# Patient Record
Sex: Male | Born: 1935 | Race: White | Hispanic: No | Marital: Married | Smoking: Former smoker
Health system: Southern US, Community
[De-identification: ages and names within clinical notes are randomized; demographics above are authoritative.]

## PROBLEM LIST (undated history)

## (undated) DIAGNOSIS — E119 Type 2 diabetes mellitus without complications: Secondary | ICD-10-CM

## (undated) DIAGNOSIS — R279 Unspecified lack of coordination: Secondary | ICD-10-CM

## (undated) DIAGNOSIS — I2510 Atherosclerotic heart disease of native coronary artery without angina pectoris: Secondary | ICD-10-CM

## (undated) DIAGNOSIS — E1139 Type 2 diabetes mellitus with other diabetic ophthalmic complication: Secondary | ICD-10-CM

## (undated) DIAGNOSIS — Z87898 Personal history of other specified conditions: Secondary | ICD-10-CM

## (undated) DIAGNOSIS — I10 Essential (primary) hypertension: Secondary | ICD-10-CM

## (undated) DIAGNOSIS — Z9181 History of falling: Secondary | ICD-10-CM

## (undated) DIAGNOSIS — I441 Atrioventricular block, second degree: Secondary | ICD-10-CM

## (undated) DIAGNOSIS — K219 Gastro-esophageal reflux disease without esophagitis: Secondary | ICD-10-CM

## (undated) DIAGNOSIS — E785 Hyperlipidemia, unspecified: Secondary | ICD-10-CM

## (undated) DIAGNOSIS — E1129 Type 2 diabetes mellitus with other diabetic kidney complication: Secondary | ICD-10-CM

## (undated) HISTORY — PX: EYE SURGERY: SHX253

## (undated) HISTORY — DX: Type 2 diabetes mellitus without complications: E11.9

## (undated) HISTORY — DX: Type 2 diabetes mellitus with other diabetic kidney complication: E11.29

## (undated) HISTORY — DX: History of falling: Z91.81

## (undated) HISTORY — DX: Atherosclerotic heart disease of native coronary artery without angina pectoris: I25.10

## (undated) HISTORY — DX: Hyperlipidemia, unspecified: E78.5

## (undated) HISTORY — PX: HERNIA REPAIR: SHX51

## (undated) HISTORY — PX: IRRIGATION AND DEBRIDEMENT SEBACEOUS CYST: SHX5255

## (undated) HISTORY — DX: Personal history of other specified conditions: Z87.898

## (undated) HISTORY — DX: Type 2 diabetes mellitus with other diabetic ophthalmic complication: E11.39

## (undated) HISTORY — DX: Unspecified lack of coordination: R27.9

## (undated) HISTORY — PX: APPENDECTOMY: SHX54

## (undated) HISTORY — DX: Gastro-esophageal reflux disease without esophagitis: K21.9

## (undated) HISTORY — DX: Atrioventricular block, second degree: I44.1

## (undated) HISTORY — DX: Essential (primary) hypertension: I10

---

## 1991-10-22 HISTORY — PX: OTHER SURGICAL HISTORY: SHX169

## 2000-03-26 ENCOUNTER — Ambulatory Visit (HOSPITAL_COMMUNITY): Admission: RE | Admit: 2000-03-26 | Discharge: 2000-03-26 | Payer: Self-pay | Admitting: Cardiology

## 2000-03-26 ENCOUNTER — Encounter: Payer: Self-pay | Admitting: Cardiology

## 2000-08-12 ENCOUNTER — Ambulatory Visit (HOSPITAL_COMMUNITY): Admission: RE | Admit: 2000-08-12 | Discharge: 2000-08-12 | Payer: Self-pay | Admitting: Cardiology

## 2001-01-30 ENCOUNTER — Ambulatory Visit (HOSPITAL_COMMUNITY): Admission: RE | Admit: 2001-01-30 | Discharge: 2001-01-30 | Payer: Self-pay | Admitting: Ophthalmology

## 2001-07-22 ENCOUNTER — Encounter: Payer: Self-pay | Admitting: Ophthalmology

## 2001-07-24 ENCOUNTER — Ambulatory Visit (HOSPITAL_COMMUNITY): Admission: RE | Admit: 2001-07-24 | Discharge: 2001-07-24 | Payer: Self-pay | Admitting: Ophthalmology

## 2003-01-20 ENCOUNTER — Encounter (INDEPENDENT_AMBULATORY_CARE_PROVIDER_SITE_OTHER): Payer: Self-pay | Admitting: Specialist

## 2003-01-20 ENCOUNTER — Ambulatory Visit (HOSPITAL_COMMUNITY): Admission: RE | Admit: 2003-01-20 | Discharge: 2003-01-20 | Payer: Self-pay | Admitting: General Surgery

## 2003-12-14 ENCOUNTER — Encounter: Admission: RE | Admit: 2003-12-14 | Discharge: 2003-12-14 | Payer: Self-pay | Admitting: Internal Medicine

## 2004-10-11 ENCOUNTER — Ambulatory Visit: Payer: Self-pay | Admitting: Internal Medicine

## 2004-10-18 ENCOUNTER — Ambulatory Visit: Payer: Self-pay | Admitting: Endocrinology

## 2004-11-19 ENCOUNTER — Ambulatory Visit: Payer: Self-pay | Admitting: Endocrinology

## 2005-01-02 ENCOUNTER — Ambulatory Visit: Payer: Self-pay | Admitting: Gastroenterology

## 2005-02-12 ENCOUNTER — Ambulatory Visit: Payer: Self-pay | Admitting: Gastroenterology

## 2005-02-12 ENCOUNTER — Encounter: Admission: RE | Admit: 2005-02-12 | Discharge: 2005-02-12 | Payer: Self-pay | Admitting: Gastroenterology

## 2005-02-21 ENCOUNTER — Ambulatory Visit (HOSPITAL_COMMUNITY): Admission: RE | Admit: 2005-02-21 | Discharge: 2005-02-21 | Payer: Self-pay | Admitting: Gastroenterology

## 2005-02-21 ENCOUNTER — Encounter (INDEPENDENT_AMBULATORY_CARE_PROVIDER_SITE_OTHER): Payer: Self-pay | Admitting: *Deleted

## 2005-02-21 ENCOUNTER — Ambulatory Visit: Payer: Self-pay | Admitting: Gastroenterology

## 2005-09-30 ENCOUNTER — Ambulatory Visit: Payer: Self-pay | Admitting: Internal Medicine

## 2005-10-08 ENCOUNTER — Ambulatory Visit: Payer: Self-pay | Admitting: Internal Medicine

## 2006-05-26 ENCOUNTER — Ambulatory Visit: Payer: Self-pay | Admitting: Endocrinology

## 2006-08-22 ENCOUNTER — Ambulatory Visit: Payer: Self-pay | Admitting: Internal Medicine

## 2006-10-21 HISTORY — PX: ESOPHAGOGASTRODUODENOSCOPY: SHX1529

## 2006-10-22 ENCOUNTER — Ambulatory Visit: Payer: Self-pay | Admitting: Internal Medicine

## 2006-10-30 ENCOUNTER — Ambulatory Visit: Payer: Self-pay | Admitting: Gastroenterology

## 2006-11-19 ENCOUNTER — Ambulatory Visit: Payer: Self-pay | Admitting: Gastroenterology

## 2006-11-19 ENCOUNTER — Encounter (INDEPENDENT_AMBULATORY_CARE_PROVIDER_SITE_OTHER): Payer: Self-pay | Admitting: *Deleted

## 2006-12-02 ENCOUNTER — Ambulatory Visit: Payer: Self-pay

## 2007-06-11 ENCOUNTER — Ambulatory Visit: Payer: Self-pay | Admitting: Endocrinology

## 2007-06-11 LAB — CONVERTED CEMR LAB
BUN: 22 mg/dL (ref 6–23)
Calcium: 9.7 mg/dL (ref 8.4–10.5)
GFR calc Af Amer: 93 mL/min
GFR calc non Af Amer: 77 mL/min
Microalb Creat Ratio: 9.9 mg/g (ref 0.0–30.0)
Microalb, Ur: 0.5 mg/dL (ref 0.0–1.9)
Potassium: 4.3 meq/L (ref 3.5–5.1)

## 2007-07-10 ENCOUNTER — Encounter: Payer: Self-pay | Admitting: *Deleted

## 2007-07-10 DIAGNOSIS — I2510 Atherosclerotic heart disease of native coronary artery without angina pectoris: Secondary | ICD-10-CM | POA: Insufficient documentation

## 2007-07-10 DIAGNOSIS — E1139 Type 2 diabetes mellitus with other diabetic ophthalmic complication: Secondary | ICD-10-CM | POA: Insufficient documentation

## 2007-07-10 DIAGNOSIS — G609 Hereditary and idiopathic neuropathy, unspecified: Secondary | ICD-10-CM | POA: Insufficient documentation

## 2007-07-10 DIAGNOSIS — E1129 Type 2 diabetes mellitus with other diabetic kidney complication: Secondary | ICD-10-CM

## 2007-07-10 DIAGNOSIS — E785 Hyperlipidemia, unspecified: Secondary | ICD-10-CM

## 2007-08-19 ENCOUNTER — Ambulatory Visit: Payer: Self-pay | Admitting: Internal Medicine

## 2007-10-05 ENCOUNTER — Telehealth: Payer: Self-pay | Admitting: Internal Medicine

## 2007-10-06 ENCOUNTER — Ambulatory Visit: Payer: Self-pay | Admitting: Internal Medicine

## 2007-10-07 DIAGNOSIS — I10 Essential (primary) hypertension: Secondary | ICD-10-CM | POA: Insufficient documentation

## 2008-02-09 ENCOUNTER — Ambulatory Visit: Payer: Self-pay | Admitting: Internal Medicine

## 2008-02-09 LAB — CONVERTED CEMR LAB
ALT: 13 units/L (ref 0–53)
AST: 18 units/L (ref 0–37)
Alkaline Phosphatase: 68 units/L (ref 39–117)
Basophils Absolute: 0 10*3/uL (ref 0.0–0.1)
Bilirubin Urine: NEGATIVE
Bilirubin, Direct: 0.1 mg/dL (ref 0.0–0.3)
CO2: 32 meq/L (ref 19–32)
Calcium: 9.6 mg/dL (ref 8.4–10.5)
Chloride: 103 meq/L (ref 96–112)
Glucose, Bld: 115 mg/dL — ABNORMAL HIGH (ref 70–99)
Hemoglobin: 11.8 g/dL — ABNORMAL LOW (ref 13.0–17.0)
LDL Cholesterol: 61 mg/dL (ref 0–99)
Leukocytes, UA: NEGATIVE
Lymphocytes Relative: 30.6 % (ref 12.0–46.0)
MCHC: 33.6 g/dL (ref 30.0–36.0)
Monocytes Relative: 8.8 % (ref 3.0–12.0)
Neutro Abs: 3.8 10*3/uL (ref 1.4–7.7)
Neutrophils Relative %: 56.3 % (ref 43.0–77.0)
Nitrite: NEGATIVE
PSA: 0.09 ng/mL — ABNORMAL LOW (ref 0.10–4.00)
Potassium: 4.5 meq/L (ref 3.5–5.1)
RBC: 3.9 M/uL — ABNORMAL LOW (ref 4.22–5.81)
RDW: 12.2 % (ref 11.5–14.6)
Sodium: 140 meq/L (ref 135–145)
Specific Gravity, Urine: 1.015 (ref 1.000–1.03)
Total Bilirubin: 0.9 mg/dL (ref 0.3–1.2)
Total CHOL/HDL Ratio: 3.6
Total Protein: 7.3 g/dL (ref 6.0–8.3)
Urobilinogen, UA: 0.2 (ref 0.0–1.0)
pH: 5.5 (ref 5.0–8.0)

## 2008-02-16 ENCOUNTER — Ambulatory Visit: Payer: Self-pay | Admitting: Internal Medicine

## 2008-02-17 ENCOUNTER — Encounter: Payer: Self-pay | Admitting: Internal Medicine

## 2008-02-17 DIAGNOSIS — R279 Unspecified lack of coordination: Secondary | ICD-10-CM

## 2008-02-17 DIAGNOSIS — K219 Gastro-esophageal reflux disease without esophagitis: Secondary | ICD-10-CM

## 2008-02-17 HISTORY — DX: Gastro-esophageal reflux disease without esophagitis: K21.9

## 2008-02-17 HISTORY — DX: Unspecified lack of coordination: R27.9

## 2008-02-24 ENCOUNTER — Encounter: Admission: RE | Admit: 2008-02-24 | Discharge: 2008-02-24 | Payer: Self-pay | Admitting: Internal Medicine

## 2008-02-28 ENCOUNTER — Encounter: Payer: Self-pay | Admitting: Internal Medicine

## 2008-06-28 ENCOUNTER — Ambulatory Visit: Payer: Self-pay | Admitting: Internal Medicine

## 2008-06-28 ENCOUNTER — Telehealth: Payer: Self-pay | Admitting: Internal Medicine

## 2008-06-28 DIAGNOSIS — R079 Chest pain, unspecified: Secondary | ICD-10-CM | POA: Insufficient documentation

## 2008-08-11 ENCOUNTER — Ambulatory Visit: Payer: Self-pay | Admitting: Internal Medicine

## 2008-10-12 ENCOUNTER — Telehealth: Payer: Self-pay | Admitting: Internal Medicine

## 2008-11-07 ENCOUNTER — Telehealth: Payer: Self-pay | Admitting: Endocrinology

## 2009-01-27 ENCOUNTER — Telehealth (INDEPENDENT_AMBULATORY_CARE_PROVIDER_SITE_OTHER): Payer: Self-pay | Admitting: *Deleted

## 2009-01-30 ENCOUNTER — Ambulatory Visit: Payer: Self-pay | Admitting: Endocrinology

## 2009-02-08 ENCOUNTER — Telehealth: Payer: Self-pay | Admitting: Family Medicine

## 2009-02-13 ENCOUNTER — Ambulatory Visit: Payer: Self-pay | Admitting: Family Medicine

## 2009-02-13 DIAGNOSIS — Z87898 Personal history of other specified conditions: Secondary | ICD-10-CM

## 2009-02-13 HISTORY — DX: Personal history of other specified conditions: Z87.898

## 2009-02-17 ENCOUNTER — Ambulatory Visit: Payer: Self-pay | Admitting: Family Medicine

## 2009-02-17 LAB — CONVERTED CEMR LAB
BUN: 23 mg/dL (ref 6–23)
Basophils Absolute: 0.1 10*3/uL (ref 0.0–0.1)
Bilirubin Urine: NEGATIVE
Bilirubin, Direct: 0 mg/dL (ref 0.0–0.3)
Blood in Urine, dipstick: NEGATIVE
Cholesterol: 131 mg/dL (ref 0–200)
Creatinine, Ser: 1 mg/dL (ref 0.4–1.5)
GFR calc non Af Amer: 76.14 mL/min (ref >60)
Glucose, Bld: 126 mg/dL — ABNORMAL HIGH (ref 70–99)
Glucose, Urine, Semiquant: NEGATIVE
HCT: 34.8 % — ABNORMAL LOW (ref 39.0–52.0)
Hgb A1c MFr Bld: 6.9 % — ABNORMAL HIGH (ref 4.6–6.5)
Ketones, urine, test strip: NEGATIVE
LDL Cholesterol: 74 mg/dL (ref 0–99)
Lymphs Abs: 2.6 10*3/uL (ref 0.7–4.0)
MCV: 90.6 fL (ref 78.0–100.0)
Monocytes Absolute: 0.7 10*3/uL (ref 0.1–1.0)
Monocytes Relative: 7.2 % (ref 3.0–12.0)
Neutrophils Relative %: 62.6 % (ref 43.0–77.0)
PSA: 0.09 ng/mL — ABNORMAL LOW (ref 0.10–4.00)
Platelets: 255 10*3/uL (ref 150.0–400.0)
Potassium: 4.9 meq/L (ref 3.5–5.1)
Protein, U semiquant: NEGATIVE
RDW: 12.1 % (ref 11.5–14.6)
TSH: 2.28 microintl units/mL (ref 0.35–5.50)
Total Bilirubin: 0.7 mg/dL (ref 0.3–1.2)
Triglycerides: 97 mg/dL (ref 0.0–149.0)
Urobilinogen, UA: 0.2
VLDL: 19.4 mg/dL (ref 0.0–40.0)
pH: 5

## 2009-02-24 ENCOUNTER — Ambulatory Visit: Payer: Self-pay | Admitting: Family Medicine

## 2009-02-24 DIAGNOSIS — E119 Type 2 diabetes mellitus without complications: Secondary | ICD-10-CM | POA: Insufficient documentation

## 2009-04-07 ENCOUNTER — Telehealth: Payer: Self-pay | Admitting: Endocrinology

## 2009-04-21 ENCOUNTER — Telehealth: Payer: Self-pay | Admitting: Endocrinology

## 2009-05-31 ENCOUNTER — Ambulatory Visit: Payer: Self-pay | Admitting: Family Medicine

## 2009-09-25 ENCOUNTER — Ambulatory Visit: Payer: Self-pay | Admitting: Family Medicine

## 2009-09-26 LAB — CONVERTED CEMR LAB: Hgb A1c MFr Bld: 6.8 % — ABNORMAL HIGH (ref 4.6–6.5)

## 2009-10-02 ENCOUNTER — Ambulatory Visit: Payer: Self-pay | Admitting: Family Medicine

## 2009-10-16 ENCOUNTER — Telehealth (INDEPENDENT_AMBULATORY_CARE_PROVIDER_SITE_OTHER): Payer: Self-pay | Admitting: *Deleted

## 2009-10-22 ENCOUNTER — Encounter: Payer: Self-pay | Admitting: Internal Medicine

## 2009-12-14 ENCOUNTER — Encounter: Payer: Self-pay | Admitting: Internal Medicine

## 2009-12-27 ENCOUNTER — Ambulatory Visit: Payer: Self-pay | Admitting: Family Medicine

## 2009-12-28 LAB — CONVERTED CEMR LAB
ALT: 14 units/L (ref 0–53)
AST: 19 units/L (ref 0–37)
Albumin: 3.8 g/dL (ref 3.5–5.2)
BUN: 23 mg/dL (ref 6–23)
CO2: 30 meq/L (ref 19–32)
Chloride: 107 meq/L (ref 96–112)
Cholesterol: 116 mg/dL (ref 0–200)
Glucose, Bld: 250 mg/dL — ABNORMAL HIGH (ref 70–99)
Hgb A1c MFr Bld: 6.8 % — ABNORMAL HIGH (ref 4.6–6.5)
Potassium: 5.2 meq/L — ABNORMAL HIGH (ref 3.5–5.1)
Total Bilirubin: 0.8 mg/dL (ref 0.3–1.2)
Total Protein: 7.1 g/dL (ref 6.0–8.3)

## 2010-01-09 ENCOUNTER — Ambulatory Visit: Payer: Self-pay | Admitting: Family Medicine

## 2010-01-09 DIAGNOSIS — Z9181 History of falling: Secondary | ICD-10-CM

## 2010-01-09 HISTORY — DX: History of falling: Z91.81

## 2010-03-14 ENCOUNTER — Encounter (INDEPENDENT_AMBULATORY_CARE_PROVIDER_SITE_OTHER): Payer: Self-pay | Admitting: *Deleted

## 2010-03-28 ENCOUNTER — Ambulatory Visit: Payer: Self-pay | Admitting: Family Medicine

## 2010-04-11 ENCOUNTER — Telehealth: Payer: Self-pay | Admitting: Family Medicine

## 2010-05-10 ENCOUNTER — Telehealth: Payer: Self-pay | Admitting: Family Medicine

## 2010-05-30 ENCOUNTER — Ambulatory Visit: Payer: Self-pay | Admitting: Family Medicine

## 2010-05-31 LAB — CONVERTED CEMR LAB: Hgb A1c MFr Bld: 7 % — ABNORMAL HIGH (ref 4.6–6.5)

## 2010-06-07 ENCOUNTER — Ambulatory Visit: Payer: Self-pay | Admitting: Family Medicine

## 2010-06-19 ENCOUNTER — Telehealth: Payer: Self-pay | Admitting: Family Medicine

## 2010-09-27 ENCOUNTER — Ambulatory Visit: Payer: Self-pay | Admitting: Family Medicine

## 2010-09-28 LAB — CONVERTED CEMR LAB
CO2: 32 meq/L (ref 19–32)
Chloride: 100 meq/L (ref 96–112)
Creatinine, Ser: 1.1 mg/dL (ref 0.4–1.5)

## 2010-10-19 ENCOUNTER — Ambulatory Visit: Payer: Self-pay | Admitting: Family Medicine

## 2010-10-23 ENCOUNTER — Telehealth: Payer: Self-pay | Admitting: Family Medicine

## 2010-11-11 ENCOUNTER — Encounter: Payer: Self-pay | Admitting: Gastroenterology

## 2010-11-22 NOTE — Progress Notes (Signed)
Summary: refill mail order  Phone Note Refill Request Message from:  Patient  Refills Requested: Medication #1:  NOVOLOG MIX 70/30 70-30 % SUSP TAKE 20 UNITS Q AM AND 25 IN PM.  Medication #2:  FUROSEMIDE 20 MG  TABS once daily  Medication #3:  LISINOPRIL 20 MG  TABS once daily  Medication #4:  SIMVASTATIN 80 MG  TABS 1 q PM send to [facility]  Initial call taken by: Warnell Forester,  October 23, 2010 9:17 AM    Prescriptions: NOVOLOG MIX 70/30 70-30 % SUSP (INSULIN ASPART PROT & ASPART) TAKE 20 UNITS Q AM AND 25 IN PM  #6 vials x 3   Entered by:   Sid Falcon LPN   Authorized by:   Evelena Peat MD   Signed by:   Sid Falcon LPN on 45/40/9811   Method used:   Faxed to ...       [facility] (mail-order)             , [place]         Ph: [phone]       Fax: [phone]   RxID:   [id number] SIMVASTATIN 80 MG  TABS (SIMVASTATIN) 1 q PM  #90 x 3   Entered by:   Sid Falcon LPN   Authorized by:   Evelena Peat MD   Signed by:   Sid Falcon LPN on 27/25/3664   Method used:   Faxed to ...       [facility] (mail-order)             , [place]         Ph: [phone]       Fax: [phone]   RxID:   [id number] LISINOPRIL 20 MG  TABS (LISINOPRIL) once daily  #90 x 3   Entered by:   Sid Falcon LPN   Authorized by:   Evelena Peat MD   Signed by:   Sid Falcon LPN on 10/93/2355   Method used:   Faxed to ...       [facility] (mail-order)             , [place]         Ph: [phone]       Fax: [phone]   RxID:   [id number] FUROSEMIDE 20 MG  TABS (FUROSEMIDE) once daily  #90 Tablet x 3   Entered by:   Sid Falcon LPN   Authorized by:   Evelena Peat MD   Signed by:   Sid Falcon LPN on 48/54/6270   Method used:   Faxed to ...       [facility] (mail-order)             , [place]         Ph: [phone]       Fax: [phone]   RxID:   [id number] FINASTERIDE 5 MG  TABS (FINASTERIDE) take 1 by mouth qd  #90 x 3   Entered by:   Sid Falcon LPN  Authorized by:   Evelena Peat MD   Signed by:   Sid Falcon LPN on 78/24/2353   Method used:   Faxed to ...       [facility] (mail-order)             , [place]         Ph: [phone]       Fax: [phone]   RxID:   [id number] METFORMIN HCL 1000 MG  TABS (METFORMIN HCL)  two times a day  #180 x 3   Entered by:   Sid Falcon LPN   Authorized by:   Evelena Peat MD   Signed by:   Sid Falcon LPN on 16/07/9603   Method used:   Faxed to ...       [facility] (mail-order)             , [place]         Ph: [phone]       Fax: [phone]   RxID:   [id number]

## 2010-11-22 NOTE — Assessment & Plan Note (Signed)
Summary: 4 month rov/njr pt rsc/njr   Vital Signs:  Patient profile:   77 year old male Weight:      206 pounds BMI:     30.98 Temp:     97.8 degrees F oral Pulse rate:   72 / minute Pulse rhythm:   regular Resp:     12 per minute BP sitting:   140 / 68  (left arm) Cuff size:   regular  Vitals Entered By: Sid Falcon LPN (October 19, 2010 10:04 AM)  Nutrition Counseling: Patient's BMI is greater than 25 and therefore counseled on weight management options.  History of Present Illness: Here for medical follow up.  Diabetes.  Glucose stable and recent A1C  6.7%. No symptoms of hyperglycemia.   All meds reviewed and compliant with all. Walks some for exercise. continues to reside in retirement community with his wife.  Diabetes Management History:      He has not been enrolled in the "Diabetic Education Program".  He states understanding of dietary principles and is following his diet appropriately.  Sensory loss is noted.  Self foot exams are being performed.  He is checking home blood sugars.  He says that he is exercising.    Hypertension History:      He denies headache, chest pain, palpitations, dyspnea with exertion, orthopnea, PND, peripheral edema, visual symptoms, neurologic problems, syncope, and side effects from treatment.        Positive major cardiovascular risk factors include male age 37 years old or older, diabetes, hyperlipidemia, and hypertension.  Negative major cardiovascular risk factors include non-tobacco-user status.        Positive history for target organ damage include ASHD (either angina/prior MI/prior CABG).  Further assessment for target organ damage reveals no history of stroke/TIA or peripheral vascular disease.    Lipid Management History:      Positive NCEP/ATP III risk factors include male age 29 years old or older, diabetes, hypertension, and ASHD (either angina/prior MI/prior CABG).  Negative NCEP/ATP III risk factors include non-tobacco-user  status, no prior stroke/TIA, no peripheral vascular disease, and no history of aortic aneurysm.      Allergies: 1)  * Actos  Past History:  Past Medical History: Last updated: 01/09/2010 Coronary artery disease Diabetes mellitus, type II Hyperlipidemia Peripheral neuropathy GERD Diabetic retinopathy macular degeneration diabetic nephropathy     Physician Roster:                     Primary Care- Dr. Caryl Never                    Cardiology - Dr. Riley Kill                    Endocrin - Dr. Zettie Pho Surg - Dr. Gayland Curry -  Dr. Cecilie Kicks [facility])                    GI ----    Dr. Victorino Dike (ret)  Past Surgical History: Last updated: 02/13/2009 EGD (11/19/2006) Coronary artery bypass graft '93: LIMA to LAD sebaceous cyst excision chest wall Earlene Plater) Cataract extraction Appendectomy 1935 Hernia repair  Family History: Last updated: 02/16/2008 non-contributory in an 77 y/o  Social  History: Last updated: 02/13/2009 college grad married - a long time 4 children, 12 grandchildren, 1-2 great-grandchildren enjoys retirement: remains active, retired Airline pilot  Risk Factors: Exercise: yes (01/09/2010)  Risk Factors: Smoking Status: quit (07/10/2007) PMH-FH-SH reviewed for relevance  Review of Systems  The patient denies weight loss, hoarseness, chest pain, syncope, dyspnea on exertion, prolonged cough, headaches, hemoptysis, abdominal pain, melena, hematochezia, severe indigestion/heartburn, difficulty walking, and depression.    Physical Exam  General:  Well-developed,well-nourished,in no acute distress; alert,appropriate and cooperative throughout examination Ears:  External ear exam shows no significant lesions or deformities.  Otoscopic examination reveals clear canals, tympanic membranes are intact bilaterally without bulging, retraction, inflammation or discharge. Hearing is grossly normal  bilaterally. Mouth:  Oral mucosa and oropharynx without lesions or exudates.  Teeth in good repair. Neck:  No deformities, masses, or tenderness noted. Lungs:  Normal respiratory effort, chest expands symmetrically. Lungs are clear to auscultation, no crackles or wheezes. Heart:  normal rate and regular rhythm.   Extremities:  trace edema legs bilaterally. Neurologic:  alert & oriented X3 and cranial nerves II-XII intact.    Diabetes Management Exam:    Foot Exam (with socks and/or shoes not present):       Sensory-Pinprick/Light touch:          Left medial foot (L-4): diminished          Left dorsal foot (L-5): diminished          Left lateral foot (S-1): diminished          Right medial foot (L-4): diminished          Right dorsal foot (L-5): diminished          Right lateral foot (S-1): diminished       Sensory-Monofilament:          Left foot: diminished          Right foot: diminished       Inspection:          Left foot: normal          Right foot: normal   Impression & Recommendations:  Problem # 1:  ESSENTIAL HYPERTENSION (ICD-401.9)  His updated medication list for this problem includes:    Furosemide 20 Mg Tabs (Furosemide) ..... Once daily    Lisinopril 20 Mg Tabs (Lisinopril) ..... Once daily  Problem # 2:  CORONARY ARTERY DISEASE (ICD-414.00)  His updated medication list for this problem includes:    Adult Aspirin Low Strength 81 Mg Tbdp (Aspirin) .Marland Kitchen... Take 1 by mouth qd    Furosemide 20 Mg Tabs (Furosemide) ..... Once daily    Lisinopril 20 Mg Tabs (Lisinopril) ..... Once daily  Problem # 3:  AODM (ICD-250.00)  The following medications were removed from the medication list:    Novolog Mix 70/30 Penfill 70-30 % Susp (Insulin aspart prot & aspart) ..... Inject 20 units q am and 25 units His updated medication list for this problem includes:    Metformin Hcl 1000 Mg Tabs (Metformin hcl) .Marland Kitchen..Marland Kitchen Two times a day    Adult Aspirin Low Strength 81 Mg Tbdp (Aspirin)  .Marland Kitchen... Take 1 by mouth qd    Lisinopril 20 Mg Tabs (Lisinopril) ..... Once daily    Novolog Mix 70/30 70-30 % Susp (Insulin aspart prot & aspart) .Marland Kitchen... Take 20 units q am and 25 in pm  Problem # 4:  HYPERLIPIDEMIA (ICD-272.4)  His updated medication list for this problem includes:    Simvastatin 80 Mg Tabs (  Simvastatin) .Marland Kitchen... 1 q pm  Complete Medication List: 1)  Metformin Hcl 1000 Mg Tabs (Metformin hcl) .... Two times a day 2)  Vitamin B-12 Cr 1000 Mcg Tbcr (Cyanocobalamin) .... Take 1 injection q month 3)  Adult Aspirin Low Strength 81 Mg Tbdp (Aspirin) .... Take 1 by mouth qd 4)  Finasteride 5 Mg Tabs (Finasteride) .... Take 1 by mouth qd 5)  Furosemide 20 Mg Tabs (Furosemide) .... Once daily 6)  Juice Plus Fibre Liqd (Nutritional supplements) .... Two times a day 7)  Lisinopril 20 Mg Tabs (Lisinopril) .... Once daily 8)  Simvastatin 80 Mg Tabs (Simvastatin) .Marland Kitchen.. 1 q pm 9)  Insulin Syringe Ult Thin Short 30g X 5/16" 0.5 Ml Misc (Insulin syringe-needle u-100) .... Use sub-q two times a day 10)  Novolog Mix 70/30 70-30 % Susp (Insulin aspart prot & aspart) .... Take 20 units q am and 25 in pm  Diabetes Management Assessment/Plan:      The following lipid goals have been established for the patient: Total cholesterol goal of 200; LDL cholesterol goal of 70; HDL cholesterol goal of 40; Triglyceride goal of 150.    Hypertension Assessment/Plan:      The patient's hypertensive risk group is category C: Target organ damage and/or diabetes.  Today's blood pressure is 140/68.    Lipid Assessment/Plan:      Based on NCEP/ATP III, the patient's risk factor category is "history of coronary disease, peripheral vascular disease, cerebrovascular disease, or aortic aneurysm along with either diabetes, current smoker, or LDL > 130 plus HDL < 40 plus triglycerides > 200".  The patient's lipid goals are as follows: Total cholesterol goal is 200; LDL cholesterol goal is 70; HDL cholesterol goal is 40;  Triglyceride goal is 150.    Patient Instructions: 1)  Hepatic Panel prior to visit ICD-9: 272.4 2)  Lipid panel prior to visit ICD-9 : 272.4 3)  HgBA1c prior to visit  ICD-9: 250.00 4)  Please schedule a follow-up appointment in 3 months .    Orders Added: 1)  Est. Patient Level IV [81191]

## 2010-11-22 NOTE — Assessment & Plan Note (Signed)
Summary: 3 month rov/njr/pt rescd per wife//ccm   Vital Signs:  Patient profile:   77 year old male Weight:      210 pounds Temp:     98.5 degrees F oral BP sitting:   130 / 62  (left arm) Cuff size:   large  Vitals Entered By: Sid Falcon LPN (January 09, 2010 11:02 AM) CC: 3 month ROV, Lipid Management Is Patient Diabetic? Yes Did you bring your meter with you today? No   History of Present Illness: Follow up multiple medical problems. Patient has type 2 diabetes with peripheral neuropathy and diabetic retinopathy. He sees ophthalmologist every 9 months.  Felt somewhat weak and dizzy last night blood sugar 56 around 10 PM. No other recent hypoglycemic symptoms. Symptoms improved promptly after eating. Remains on metformin 1000 mg b.i.d. and NovoLog mix 70/30 20 units morning 25 units at supper. Recent A1c 6.8%.  Dyslipidemia treated with simvastatin. Lipids adequately controlled by recent labs last week. Denies any recent chest pains.  The patient has some balance problems. He is walking about a mile per day. No recent falls. Suspect balance issues related to peripheral neuropathy.  Diabetes Management History:      He has not been enrolled in the "Diabetic Education Program".  He states understanding of dietary principles and is following his diet appropriately.  Sensory loss is noted.  Self foot exams are being performed.  He is checking home blood sugars.  He says that he is exercising.        Reported hypoglycemic symptoms include sweats and weakness.  Frequency of hypoglycemic symptoms are reported to be seldom.  No hyperglycemic symptoms are reported.        Since his last visit, no infections have occurred.    Lipid Management History:      Positive NCEP/ATP III risk factors include male age 75 years old or older, diabetes, hypertension, and ASHD (either angina/prior MI/prior CABG).  Negative NCEP/ATP III risk factors include non-tobacco-user status, no prior stroke/TIA, no  peripheral vascular disease, and no history of aortic aneurysm.     Preventive Screening-Counseling & Management  Caffeine-Diet-Exercise     Does Patient Exercise: yes  Allergies: 1)  * Actos  Past History:  Past Surgical History: Last updated: 02/13/2009 EGD (11/19/2006) Coronary artery bypass graft '93: LIMA to LAD sebaceous cyst excision chest wall Earlene Plater) Cataract extraction Appendectomy 1935 Hernia repair  Social History: Last updated: 02/13/2009 college grad married - a long time 4 children, 12 grandchildren, 1-2 great-grandchildren enjoys retirement: remains active, retired Airline pilot  Past Medical History: Coronary artery disease Diabetes mellitus, type II Hyperlipidemia Peripheral neuropathy GERD Diabetic retinopathy macular degeneration diabetic nephropathy     Physician Roster:                     Primary Care- Dr. Caryl Never                    Cardiology - Dr. Riley Kill                    Endocrin - Dr. Zettie Pho Surg - Dr. Gayland Curry -  Dr. Cecilie Kicks [facility])  GI ----    Dr. Victorino Dike (ret) PMH-FH-SH reviewed for relevance  Social History: Does Patient Exercise:  yes  Review of Systems  The patient denies anorexia, weight loss, weight gain, chest pain, syncope, dyspnea on exertion, peripheral edema, abdominal pain, melena, hematochezia, and incontinence.         no recent falls.  Physical Exam  General:  Well-developed,well-nourished,in no acute distress; alert,appropriate and cooperative throughout examination Head:  Normocephalic and atraumatic without obvious abnormalities. No apparent alopecia or balding. Ears:  External ear exam shows no significant lesions or deformities.  Otoscopic examination reveals clear canals, tympanic membranes are intact bilaterally without bulging, retraction, inflammation or discharge. Hearing is grossly normal bilaterally. Mouth:   Oral mucosa and oropharynx without lesions or exudates.  Teeth in good repair. Neck:  No deformities, masses, or tenderness noted. Lungs:  Normal respiratory effort, chest expands symmetrically. Lungs are clear to auscultation, no crackles or wheezes. Heart:  normal rate and regular rhythm.   Extremities:  see foot exam.  No signif edema. Skin:  no rashes. Psych:  good eye contact, not anxious appearing, and not depressed appearing.    Diabetes Management Exam:    Foot Exam (with socks and/or shoes not present):       Sensory-Pinprick/Light touch:          Left medial foot (L-4): diminished          Left dorsal foot (L-5): diminished          Left lateral foot (S-1): diminished          Right medial foot (L-4): diminished          Right dorsal foot (L-5): diminished          Right lateral foot (S-1): diminished       Sensory-Monofilament:          Left foot: diminished          Right foot: diminished       Inspection:          Left foot: normal          Right foot: normal       Nails:          Left foot: normal          Right foot: normal    Eye Exam:       Eye Exam done elsewhere          Date: 07/21/2009          Results: diabetic retinopathy          Done by: [facility] physicians   Impression & Recommendations:  Problem # 1:  AODM (ICD-250.00) Assessment Unchanged Recent single episode of mild hypoglycemia.  Monitor blood sugars more frequently at night and if consistent pattern of lows then reduce nighttime insulin as outlined for pt.  F/U 3 months. His updated medication list for this problem includes:    Metformin Hcl 1000 Mg Tabs (Metformin hcl) .Marland Kitchen..Marland Kitchen Two times a day    Adult Aspirin Low Strength 81 Mg Tbdp (Aspirin) .Marland Kitchen... Take 1 by mouth qd    Novolog Mix 70/30 Penfill 70-30 % Susp (Insulin aspart prot & aspart) ..... Inject 20 units q am and 25 units pm. follow-up appt is due    Lisinopril 20 Mg Tabs (Lisinopril) ..... Once daily    Novolog Mix 70/30 70-30 %  Susp (Insulin aspart prot & aspart) .Marland Kitchen... Take 20 units q am and 25 in  pm  Problem # 2:  HYPERLIPIDEMIA (ICD-272.4) Assessment: Unchanged  His updated medication list for this problem includes:    Simvastatin 80 Mg Tabs (Simvastatin) .Marland Kitchen... 1 q pm  Problem # 3:  RISK OF FALLING (ICD-V15.88) risk factors predominately decreased vision and peripheral neuropathy.  Again offered phys therapy and at this time he wishes to wait.  No recent falls.  Encouraged to use cane.  Complete Medication List: 1)  Metformin Hcl 1000 Mg Tabs (Metformin hcl) .... Two times a day 2)  Vitamin B-12 Cr 1000 Mcg Tbcr (Cyanocobalamin) .... Take 1 injection q month 3)  Adult Aspirin Low Strength 81 Mg Tbdp (Aspirin) .... Take 1 by mouth qd 4)  Novolog Mix 70/30 Penfill 70-30 % Susp (Insulin aspart prot & aspart) .... Inject 20 units q am and 25 units pm. follow-up appt is due 5)  Finasteride 5 Mg Tabs (Finasteride) .... Take 1 by mouth qd 6)  Furosemide 20 Mg Tabs (Furosemide) .... Once daily 7)  Juice Plus Fibre Liqd (Nutritional supplements) .... Two times a day 8)  Lisinopril 20 Mg Tabs (Lisinopril) .... Once daily 9)  Simvastatin 80 Mg Tabs (Simvastatin) .Marland Kitchen.. 1 q pm 10)  Insulin Syringe Ult Thin Short 30g X 5/16" 0.5 Ml Misc (Insulin syringe-needle u-100) .... Use sub-q two times a day 11)  Novolog Mix 70/30 70-30 % Susp (Insulin aspart prot & aspart) .... Take 20 units q am and 25 in pm  Diabetes Management Assessment/Plan:      The following lipid goals have been established for the patient: Total cholesterol goal of 200; LDL cholesterol goal of 70; HDL cholesterol goal of 40; Triglyceride goal of 150.    Lipid Assessment/Plan:      Based on NCEP/ATP III, the patient's risk factor category is "history of coronary disease, peripheral vascular disease, cerebrovascular disease, or aortic aneurysm along with either diabetes, current smoker, or LDL > 130 plus HDL < 40 plus triglycerides > 200".  The patient's lipid  goals are as follows: Total cholesterol goal is 200; LDL cholesterol goal is 70; HDL cholesterol goal is 40; Triglyceride goal is 150.    Patient Instructions: 1)  Check blood sugars occasionally around 10 PM. If consistent blood sugars below 60 reduce nighttime insulin to 22 units. 2)  Check your blood sugars regularly. If your readings are usually above:  or below 70 you should contact our office.  3)  It is important that your diabetic A1c level is checked every 3 months.  4)  See your eye doctor yearly to check for diabetic eye damage. 5)  Check your feet each night  for sore areas, calluses or signs of infection.  6)  Please schedule a follow-up appointment in 3 months .

## 2010-11-22 NOTE — Progress Notes (Signed)
Summary: Pt req refills of meds to [facility] mail order  Phone Note Refill Request Call back at Home Phone [phone] Message from:  Patient on May 10, 2010 10:48 AM  Refills Requested: Medication #1:  METFORMIN HCL 1000 MG  TABS two times a day  Medication #2:  NOVOLOG MIX 70/30 PENFILL 70-30 %  SUSP INJECT 20 UNITS Q AM AND 25 UNITS PM. follow-up appt is due  Medication #3:  FUROSEMIDE 20 MG  TABS once daily  Medication #4:  FINASTERIDE 5 MG  TABS take 1 by mouth qd Pt also needs Lisinopril, Simvastatin. Pls call these in to [facility] mail order [phone]  Autorize refill.  Pt will then contact [facility] to place his order.  Pt does not need insulin right now.           Method Requested: Telephone to [employer]  Initial call taken by: Lucy Antigua,  May 10, 2010 10:52 AM    New/Updated Medications: NOVOLOG MIX 70/30 PENFILL 70-30 %  SUSP (INSULIN ASPART PROT & ASPART) INJECT 20 UNITS Q AM AND 25 UNITS Prescriptions: NOVOLOG MIX 70/30 70-30 % SUSP (INSULIN ASPART PROT & ASPART) TAKE 20 UNITS Q AM AND 25 IN PM  #6 vials x 3   Entered by:   Sid Falcon LPN   Authorized by:   Evelena Peat MD   Signed by:   Sid Falcon LPN on 95/62/1308   Method used:   Faxed to ...       [facility] (mail-order)             , [place]         Ph: [phone]       Fax: [phone]   RxID:   [id number] SIMVASTATIN 80 MG  TABS (SIMVASTATIN) 1 q PM  #90 x 3   Entered by:   Sid Falcon LPN   Authorized by:   Evelena Peat MD   Signed by:   Sid Falcon LPN on 25/95/6387   Method used:   Faxed to ...       [facility] (mail-order)             , [place]         Ph: [phone]       Fax: [phone]   RxID:   [id number] LISINOPRIL 20 MG  TABS (LISINOPRIL) once daily  #90 x 3   Entered by:   Sid Falcon LPN   Authorized by:   Evelena Peat MD   Signed by:   Sid Falcon LPN on 54/27/0623   Method used:   Faxed to ...       [facility] (mail-order)             , [place]         Ph:  [phone]       Fax: [phone]   RxID:   [id number] FINASTERIDE 5 MG  TABS (FINASTERIDE) take 1 by mouth qd  #90 x 3   Entered by:   Sid Falcon LPN   Authorized by:   Evelena Peat MD   Signed by:   Sid Falcon LPN on 81/82/9937   Method used:   Faxed to ...       [facility] (mail-order)             , [place]         Ph: [phone]       Fax: [phone]   RxID:   [id number] FUROSEMIDE 20 MG  TABS (  FUROSEMIDE) once daily  #90 Tablet x 3   Entered by:   Sid Falcon LPN   Authorized by:   Evelena Peat MD   Signed by:   Sid Falcon LPN on 04/54/0981   Method used:   Faxed to ...       [facility] (mail-[facility])             , [place]         Ph: [phone]       Fax: [phone]   RxID:   [id number] METFORMIN HCL 1000 MG  TABS (METFORMIN HCL) two times a day  #180 x 3   Entered by:   Sid Falcon LPN   Authorized by:   Evelena Peat MD   Signed by:   Sid Falcon LPN on 02/72/5366   Method used:   Faxed to ...       [facility] (mail-order)             , [place]         Ph: [phone]       Fax: [phone]   RxID:   [id number]

## 2010-11-22 NOTE — Medication Information (Signed)
Summary: [employer]  [employer]   Imported By: Sherian Rein 12/18/2009 08:09:53  _____________________________________________________________________  External Attachment:    Type:   Image     Comment:   External Document

## 2010-11-22 NOTE — Letter (Signed)
Summary: Colonoscopy-Changed to Office Visit Letter  [facility]  [address] [place], [place] [address]   Phone: [phone]  Fax: [phone]      Mar 14, 2010 MRN: [id number]   [facility] [id number] [place], [place]  [address]   Dear Mr. Reasons,   According to our records, it is time for you to schedule a Colonoscopy. However, after reviewing your medical record, I feel that an office visit would be most appropriate to more completely evaluate you and determine your need for a repeat procedure.  Please call [phone] (option #2) at your convenience to schedule an office visit. If you have any questions, concerns, or feel that this letter is in error, we would appreciate your call.   Sincerely,  Iva Boop, M.D.  [facility] [phone]

## 2010-11-22 NOTE — Progress Notes (Signed)
Summary: Insulin pouch lost  Phone Note Call from Patient   Caller: Daughter Garwin Brothers [phone] Call For: Evelena Peat MD Summary of Call: Daughter Lawson Fiscal stopped by office to ask for Insulin pen.  Pt is on Novolog mix 70/30.   LMTCB  Initial call taken by: Sid Falcon LPN,  April 11, 2010 9:57 AM  Follow-up for Phone Call        Daughter called, lost pouch for insulin, not the pen but a special small pouch for travel he puts in his pocket.  Explained we do not have what he needs, lost here. Follow-up by: Sid Falcon LPN,  April 11, 2010 12:14 PM

## 2010-11-22 NOTE — Assessment & Plan Note (Signed)
Summary: FORM COMPLETION (VETERANS BENEFIT FORM) // RS   Vital Signs:  Patient profile:   77 year old male Weight:      209 pounds Temp:     98.4 degrees F oral BP sitting:   130 / 70  (left arm)  Vitals Entered By: Kathrynn Speed CMA (March 28, 2010 3:03 PM) CC: Form filled out for [employer] Benefits   History of Present Illness: Here for form completion for [employer]. Assisted living facility and trying to get assistance with coverage.  His requirement for assistance stems mostly from diabetic neuropathy and retinopathy.  Increased risks of falls.  Diabetes has been well controlled recently.  No symptoms of hyper or hypoglycemia.  Occasionaly use cane for ambulation but not consistently.  pt compliant with all meds.  Denies any side effects.  Diabetes Management History:      He has not been enrolled in the "Diabetic Education Program".  He states understanding of dietary principles and is following his diet appropriately.  Sensory loss is noted.  Self foot exams are being performed.  He is checking home blood sugars.  He says that he is exercising.        Hypoglycemic symptoms are not occurring.  No hyperglycemic symptoms are reported.        Symptoms which suggest diabetic complications include vision problems and paresthesias.  No changes have been made to his treatment plan since last visit.    Current Medications (verified): 1)  Metformin Hcl 1000 Mg  Tabs (Metformin Hcl) .... Two Times A Day 2)  Vitamin B-12 Cr 1000 Mcg  Tbcr (Cyanocobalamin) .... Take 1 Injection Q Month 3)  Adult Aspirin Low Strength 81 Mg  Tbdp (Aspirin) .... Take 1 By Mouth Qd 4)  Novolog Mix 70/30 Penfill 70-30 %  Susp (Insulin Aspart Prot & Aspart) .... Inject 20 Units Q Am and 25 Units Pm. Follow-Up Appt Is Due 5)  Finasteride 5 Mg  Tabs (Finasteride) .... Take 1 By Mouth Qd 6)  Furosemide 20 Mg  Tabs (Furosemide) .... Once Daily 7)  Juice Plus Fibre   Liqd (Nutritional Supplements) ....  Two Times A Day 8)  Lisinopril 20 Mg  Tabs (Lisinopril) .... Once Daily 9)  Simvastatin 80 Mg  Tabs (Simvastatin) .Marland Kitchen.. 1 Q Pm 10)  Insulin Syringe Ult Thin Short 30g X 5/16" 0.5 Ml Misc (Insulin Syringe-Needle U-100) .... Use Sub-Q Two Times A Day 11)  Novolog Mix 70/30 70-30 % Susp (Insulin Aspart Prot & Aspart) .... Take 20 Units Q Am and 25 in Pm  Allergies (verified): 1)  * Actos  Past History:  Past Medical History: Last updated: 01/09/2010 Coronary artery disease Diabetes mellitus, type II Hyperlipidemia Peripheral neuropathy GERD Diabetic retinopathy macular degeneration diabetic nephropathy     Physician Roster:                     Primary Care- Dr. Caryl Never                    Cardiology - Dr. Riley Kill                    Endocrin - Dr. Zettie Pho Surg - Dr. Gayland Curry -  Dr. Cecilie Kicks [facility]  [facility])                    GI ----    Dr. 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    Physical Exam  General:  Well-developed,well-nourished,in no acute distress; alert,appropriate and cooperative throughout examination Head:  Normocephalic and atraumatic without obvious abnormalities. No apparent alopecia or balding. Eyes:  pupils equal, pupils round, and pupils reactive to light.   Mouth:  Oral  mucosa and oropharynx without lesions or exudates.  Teeth in good repair. Neck:  No deformities, masses, or tenderness noted. Lungs:  Normal respiratory effort, chest expands symmetrically. Lungs are clear to auscultation, no crackles or wheezes. Heart:  Normal rate and regular rhythm. S1 and S2 normal without gallop, murmur, click, rub or other extra sounds. Abdomen:  soft and non-tender.   Extremities:  no edema or clubbing. Neurologic:  alert & oriented X3, cranial nerves II-XII intact, and strength normal in all extremities.  sensory impairment feet and lower legs bil. Romberg- pt has some mild difficulty sec to neuropathy. Skin:  no rashes.   Cervical Nodes:  No lymphadenopathy noted Psych:  normally interactive, good eye contact, not anxious appearing, and not depressed appearing.     Impression & Recommendations:  Problem # 1:  RISK OF FALLING (ICD-V15.88) pt is encouraged to use cane more frequently.  Problem # 2:  PERIPHERAL NEUROPATHY (ICD-356.9) Assessment: Unchanged  Problem # 3:  DIABETIC  RETINOPATHY (ICD-250.50)  His updated medication list for this problem includes:    Metformin Hcl 1000 Mg Tabs (Metformin hcl) .Marland Kitchen..Marland Kitchen Two times a day    Adult Aspirin Low Strength 81 Mg Tbdp (Aspirin) .Marland Kitchen... Take 1 by mouth qd    Novolog Mix 70/30 Penfill 70-30 % Susp (Insulin aspart prot & aspart) ..... Inject 20 units q am and 25 units pm. follow-up appt is due    Lisinopril 20 Mg Tabs (Lisinopril) ..... Once daily    Novolog Mix 70/30 70-30 % Susp (Insulin aspart prot & aspart) .Marland Kitchen... Take 20 units q am and 25 in pm  Problem # 4:  AODM (ICD-250.00) recent A1C 6.8%. His updated medication list for this problem includes:    Metformin Hcl 1000 Mg Tabs (Metformin hcl) .Marland Kitchen..Marland Kitchen Two times a day    Adult Aspirin Low Strength 81 Mg Tbdp (Aspirin) .Marland Kitchen... Take 1 by mouth qd    Novolog Mix 70/30 Penfill 70-30 % Susp (Insulin aspart prot & aspart) ..... Inject 20 units q am and 25 units pm. follow-up  appt is due    Lisinopril 20 Mg Tabs (Lisinopril) ..... Once daily    Novolog Mix 70/30 70-30 % Susp (Insulin aspart prot & aspart) .Marland Kitchen... Take 20 units q am and 25 in pm  Complete Medication List: 1)  Metformin Hcl 1000 Mg Tabs (Metformin hcl) .... Two times a day 2)  Vitamin B-12 Cr 1000 Mcg Tbcr (Cyanocobalamin) .... Take 1 injection q month 3)  Adult Aspirin Low Strength 81 Mg Tbdp (Aspirin) .Marland KitchenMarland KitchenMarland Kitchen  Take 1 by mouth qd 4)  Novolog Mix 70/30 Penfill 70-30 % Susp (Insulin aspart prot & aspart) .... Inject 20 units q am and 25 units pm. follow-up appt is due 5)  Finasteride 5 Mg Tabs (Finasteride) .... Take 1 by mouth qd 6)  Furosemide 20 Mg Tabs (Furosemide) .... Once daily 7)  Juice Plus Fibre Liqd (Nutritional supplements) .... Two times a day 8)  Lisinopril 20 Mg Tabs (Lisinopril) .... Once daily 9)  Simvastatin 80 Mg Tabs (Simvastatin) .Marland Kitchen.. 1 q pm 10)  Insulin Syringe Ult Thin Short 30g X 5/16" 0.5 Ml Misc (Insulin syringe-needle u-100) .... Use sub-q two times a day 11)  Novolog Mix 70/30 70-30 % Susp (Insulin aspart prot & aspart) .... Take 20 units q am and 25 in pm  Diabetes Management Assessment/Plan:      The following lipid goals have been established for the patient: Total cholesterol goal of 200; LDL cholesterol goal of 70; HDL cholesterol goal of 40; Triglyceride goal of 150.

## 2010-11-22 NOTE — Medication Information (Signed)
Summary: [facility] [employer] [facility]  [facility]   Imported By: Sherian Rein 10/25/2009 08:20:45  _____________________________________________________________________  External Attachment:    Type:   Image     Comment:   External Document

## 2010-11-22 NOTE — Letter (Signed)
Summary: Examination for Housebound Status/Dept. of [employer]  Examination for [employer] of [employer]   Imported By: Maryln Gottron 04/05/2010 10:02:29  _____________________________________________________________________  External Attachment:    Type:   Image     Comment:   External Document

## 2010-11-22 NOTE — Progress Notes (Signed)
Summary: refill Meds to [facility]  Phone Note Refill Request Call back at Home Phone [phone] Message from:  Patient---live call  Refills Requested: Medication #1:  METFORMIN HCL 1000 MG  TABS two times a day  Medication #2:  FINASTERIDE 5 MG  TABS take 1 by mouth qd send to [facility]  Initial call taken by: Warnell Forester,  October 23, 2010 9:18 AM  Follow-up for Phone Call        Done on earlier phone note Follow-up by: Sid Falcon LPN,  October 23, 2010 1:45 PM

## 2010-11-22 NOTE — Assessment & Plan Note (Signed)
Summary: 4 month rov/njr   Vital Signs:  Patient profile:   77 year old male Weight:      208 pounds Temp:     98.0 degrees F BP sitting:   130 / 68  (left arm) Cuff size:   regular  Vitals Entered By: Sid Falcon LPN (June 07, 2010 10:02 AM)  History of Present Illness: Here for follow up DM, hypertension, and hyperlipidemia. Has had couple episodes of glucose around 50 recently at night.  Diabetes Management History:      He has not been enrolled in the "Diabetic Education Program".  He states understanding of dietary principles and is following his diet appropriately.  No sensory loss is reported.  Self foot exams are being performed.  He is checking home blood sugars.  He says that he is exercising.        Reported hypoglycemic symptoms include sweats and weakness.  No hyperglycemic symptoms are reported.        There are no symptoms to suggest diabetic complications.  No changes have been made to his treatment plan since last visit.    Hypertension History:      He denies headache, chest pain, palpitations, dyspnea with exertion, orthopnea, PND, peripheral edema, visual symptoms, neurologic problems, syncope, and side effects from treatment.  He notes no problems with any antihypertensive medication side effects.        Positive major cardiovascular risk factors include male age 84 years old or older, diabetes, hyperlipidemia, and hypertension.  Negative major cardiovascular risk factors include non-tobacco-user status.        Positive history for target organ damage include ASHD (either angina/prior MI/prior CABG).  Further assessment for target organ damage reveals no history of stroke/TIA or peripheral vascular disease.     Allergies: 1)  * Actos  Past History:  Past Medical History: Last updated: 01/09/2010 Coronary artery disease Diabetes mellitus, type II Hyperlipidemia Peripheral neuropathy GERD Diabetic retinopathy macular degeneration diabetic  nephropathy     Physician Roster:                     Primary Care- Dr. Caryl Never                    Cardiology - Dr. Riley Kill                    Endocrin - Dr. Zettie Pho Surg - Dr. Gayland Curry -  Dr. Cecilie Kicks [facility])                    GI ----    Dr. Victorino Dike (ret)  Past Surgical History: Last updated: 02/13/2009 EGD (11/19/2006) Coronary artery bypass graft '93: LIMA to LAD sebaceous cyst excision chest wall Earlene Plater) Cataract extraction Appendectomy 1935 Hernia repair  Family History: Last updated: 02/16/2008 non-contributory in an 77 y/o  Social History: Last updated: 02/13/2009 college grad married - a long time 4 children, 12 grandchildren, 1-2 great-grandchildren enjoys retirement: remains active, retired Airline pilot  Risk Factors: Exercise: yes (01/09/2010)  Risk Factors: Smoking Status: quit (07/10/2007) PMH-FH-SH reviewed for relevance  Review of Systems  The patient denies anorexia, fever, weight loss, chest pain, syncope, dyspnea on exertion, peripheral edema, prolonged cough,  headaches, hemoptysis, abdominal pain, melena, hematochezia, and severe indigestion/heartburn.    Physical Exam  General:  Well-developed,well-nourished,in no acute distress; alert,appropriate and cooperative throughout examination Head:  Normocephalic and atraumatic without obvious abnormalities. No apparent alopecia or balding. Eyes:  pupils equal, pupils round, and pupils reactive to light.   Ears:  External ear exam shows no significant lesions or deformities.  Otoscopic examination reveals clear canals, tympanic membranes are intact bilaterally without bulging, retraction, inflammation or discharge. Hearing is grossly normal bilaterally. Mouth:  Oral mucosa and oropharynx without lesions or exudates.  Teeth in good repair. Neck:  No deformities, masses, or tenderness noted. Lungs:  Normal respiratory effort, chest  expands symmetrically. Lungs are clear to auscultation, no crackles or wheezes. Heart:  normal rate and regular rhythm.   Extremities:  No clubbing, cyanosis, edema, or deformity noted with normal full range of motion of all joints.   Neurologic:  decreased sensation feet-see foot exam.  Diabetes Management Exam:    Foot Exam (with socks and/or shoes not present):       Sensory-Pinprick/Light touch:          Left medial foot (L-4): diminished          Left dorsal foot (L-5): diminished          Left lateral foot (S-1): diminished          Right medial foot (L-4): diminished          Right dorsal foot (L-5): diminished          Right lateral foot (S-1): diminished       Sensory-Monofilament:          Left foot: diminished          Right foot: diminished       Inspection:          Left foot: normal          Right foot: normal    Eye Exam:       Eye Exam done elsewhere          Date: 01/24/2010          Results: normal          Done by: [facility]   Impression & Recommendations:  Problem # 1:  ESSENTIAL HYPERTENSION (ICD-401.9)  His updated medication list for this problem includes:    Furosemide 20 Mg Tabs (Furosemide) ..... Once daily    Lisinopril 20 Mg Tabs (Lisinopril) ..... Once daily  Problem # 2:  RISK OF FALLING (ICD-V15.88) sec to visual impairment and peripheral neuropathy. He is encouraged to use cane more often.  Problem # 3:  AODM (ICD-250.00) A1C at goal.  Reassess in 3 months.  Consider reduce night insulin to 20 units if further episodes of hypoglycemia. His updated medication list for this problem includes:    Metformin Hcl 1000 Mg Tabs (Metformin hcl) .Marland Kitchen..Marland Kitchen Two times a day    Adult Aspirin Low Strength 81 Mg Tbdp (Aspirin) .Marland Kitchen... Take 1 by mouth qd    Novolog Mix 70/30 Penfill 70-30 % Susp (Insulin aspart prot & aspart) ..... Inject 20 units q am and 25 units    Lisinopril 20 Mg Tabs (Lisinopril) ..... Once daily    Novolog Mix 70/30 70-30 %  Susp (Insulin aspart prot & aspart) .Marland Kitchen... Take 20 units q am and 25 in pm  Complete Medication List: 1)  Metformin Hcl 1000 Mg Tabs (Metformin hcl) .... Two times a day 2)  Vitamin B-12  Cr 1000 Mcg Tbcr (Cyanocobalamin) .... Take 1 injection q month 3)  Adult Aspirin Low Strength 81 Mg Tbdp (Aspirin) .... Take 1 by mouth qd 4)  Novolog Mix 70/30 Penfill 70-30 % Susp (Insulin aspart prot & aspart) .... Inject 20 units q am and 25 units 5)  Finasteride 5 Mg Tabs (Finasteride) .... Take 1 by mouth qd 6)  Furosemide 20 Mg Tabs (Furosemide) .... Once daily 7)  Juice Plus Fibre Liqd (Nutritional supplements) .... Two times a day 8)  Lisinopril 20 Mg Tabs (Lisinopril) .... Once daily 9)  Simvastatin 80 Mg Tabs (Simvastatin) .Marland Kitchen.. 1 q pm 10)  Insulin Syringe Ult Thin Short 30g X 5/16" 0.5 Ml Misc (Insulin syringe-needle u-100) .... Use sub-q two times a day 11)  Novolog Mix 70/30 70-30 % Susp (Insulin aspart prot & aspart) .... Take 20 units q am and 25 in pm  Diabetes Management Assessment/Plan:      The following lipid goals have been established for the patient: Total cholesterol goal of 200; LDL cholesterol goal of 70; HDL cholesterol goal of 40; Triglyceride goal of 150.    Hypertension Assessment/Plan:      The patient's hypertensive risk group is category C: Target organ damage and/or diabetes.  Today's blood pressure is 130/68.    Patient Instructions: 1)  Reduce nightime insulin to 20 units if continued episodes of low blood sugar. 2)  Use cane for ambulation. 3)  BMP prior to visit, ICD-9: 401.9 4)  HgBA1c prior to visit  ICD-9: 250.00

## 2010-11-22 NOTE — Progress Notes (Signed)
Summary: Pt req refill of syringes 1 1/2" cap 8mm  G-31 to [facility]  Phone Note Call from Patient Call back at [facility] Phone [phone]   Refills Requested: Medication #1:  INSULIN SYRINGE ULT THIN SHORT 30G X 5/16" 0.5 ML MISC use sub-q two times a day   Dosage confirmed as above?Dosage Confirmed Caller: Patient Summary of Call: Pt called and has run out of his insulin syringes. Pls call in to [facility] [address]. Pt says the syringes are 1 1/2" capacity and Lenghth is 8mm and its Gauge is 31 G.  Initial call taken by: Lucy Antigua,  June 19, 2010 3:54 PM    Prescriptions: INSULIN SYRINGE ULT THIN SHORT 30G X 5/16" 0.5 ML MISC (INSULIN SYRINGE-NEEDLE U-100) use sub-q two times a day  #100 Each x 2   Entered by:   Sid Falcon LPN   Authorized by:   Evelena Peat MD   Signed by:   Sid Falcon LPN on 09/81/1914   Method used:   Electronically to        [facility]. [phone]* (retail)       [phone] [address]       [place], [place]  [address]       Ph: [phone]       Fax: [phone]   RxID:   [id number]

## 2011-01-15 ENCOUNTER — Encounter: Payer: Self-pay | Admitting: Family Medicine

## 2011-01-17 ENCOUNTER — Encounter: Payer: Self-pay | Admitting: Family Medicine

## 2011-01-17 ENCOUNTER — Ambulatory Visit (INDEPENDENT_AMBULATORY_CARE_PROVIDER_SITE_OTHER): Payer: Medicare Other | Admitting: Family Medicine

## 2011-01-17 DIAGNOSIS — E119 Type 2 diabetes mellitus without complications: Secondary | ICD-10-CM

## 2011-01-17 DIAGNOSIS — R3915 Urgency of urination: Secondary | ICD-10-CM

## 2011-01-17 DIAGNOSIS — I10 Essential (primary) hypertension: Secondary | ICD-10-CM

## 2011-01-17 DIAGNOSIS — Z87898 Personal history of other specified conditions: Secondary | ICD-10-CM

## 2011-01-17 DIAGNOSIS — E785 Hyperlipidemia, unspecified: Secondary | ICD-10-CM

## 2011-01-17 LAB — LIPID PANEL: Cholesterol: 125 mg/dL (ref 0–200)

## 2011-01-17 LAB — HEPATIC FUNCTION PANEL
ALT: 13 U/L (ref 0–53)
AST: 18 U/L (ref 0–37)
Albumin: 4 g/dL (ref 3.5–5.2)

## 2011-01-17 NOTE — Patient Instructions (Signed)
Start VESIcare 5 mg one daily

## 2011-01-17 NOTE — Progress Notes (Signed)
  Subjective:    Patient ID: [name] [name], male    DOB: 1925-07-26, 77 y.o.   MRN: [id number]  HPI Patient seen for followup regarding multiple medical problems. History of type 2 diabetes, diabetic retinopathy, diabetic neuropathy, hyperlipidemia, hypertension, CAD history, and BPH.  He has no obstructive urinary symptoms. Does have frequent urination at night about every 2 hours and some urine urgency. No burning with urination. No fever or chills. Good flow. Takes finasteride 5 mg daily for BPH symptoms. Urgency is new. Generally restricts caffeine after noon and night.  Type 2 diabetes well controlled. Fasting blood sugars consistently 120 or less. Recent A1c 6.7%. No hypoglycemia.   Hyperlipidemia treated with simvastatin. Compliant with therapy. No myalgias. Needs repeat lab work.   Review of Systems  Constitutional: Negative for chills, activity change and appetite change.  Eyes: Negative for visual disturbance.  Respiratory: Negative for cough and shortness of breath.   Cardiovascular: Negative for chest pain, palpitations and leg swelling.  Genitourinary: Positive for urgency and frequency. Negative for hematuria, difficulty urinating and testicular pain.  Musculoskeletal: Negative for back pain.  Neurological: Negative for syncope and headaches.  Hematological: Negative for adenopathy.  Psychiatric/Behavioral: Negative for confusion, dysphoric mood and agitation.       Objective:   Physical Exam  Constitutional: He is oriented to person, place, and time. He appears well-developed and well-nourished. No distress.  HENT:  Head: Normocephalic and atraumatic.  Right Ear: External ear normal.       Moderate cerumen left canal  Eyes: Conjunctivae are normal. Pupils are equal, round, and reactive to light.  Neck: Normal range of motion. Neck supple. No thyromegaly present.  Cardiovascular: Normal rate, regular rhythm and normal heart sounds.  Exam reveals no gallop.   No  murmur heard. Pulmonary/Chest: Effort normal and breath sounds normal. No respiratory distress. He has no wheezes. He has no rales.  Musculoskeletal: He exhibits no edema.       No foot lesions no calluses  Lymphadenopathy:    He has no cervical adenopathy.  Neurological: He is alert and oriented to person, place, and time. No cranial nerve deficit.       Sensory impairment to touch and monofilament both feet  Skin: Skin is warm. No rash noted. No erythema.  Psychiatric: He has a normal mood and affect.          Assessment & Plan:  #1 type 2 diabetes. Excellent control history. Reassess A1c #2 hyperlipidemia. Reassess labs #3 hypertension stable continue current medications #4 urinary urgency. Discussed options. He has no obstructive symptoms.  Trial of low-dose VESIcare 5 mg each bedtime and reviewed possible side effects. Reassess one month

## 2011-01-17 NOTE — Progress Notes (Signed)
Quick Note:  Pt informed on home VM ______ 

## 2011-02-21 ENCOUNTER — Encounter: Payer: Self-pay | Admitting: Family Medicine

## 2011-02-21 ENCOUNTER — Ambulatory Visit (INDEPENDENT_AMBULATORY_CARE_PROVIDER_SITE_OTHER): Payer: Medicare Other | Admitting: Family Medicine

## 2011-02-21 VITALS — BP 132/62 | Temp 98.4°F | Wt 205.0 lb

## 2011-02-21 DIAGNOSIS — R3915 Urgency of urination: Secondary | ICD-10-CM | POA: Insufficient documentation

## 2011-02-21 MED ORDER — SOLIFENACIN SUCCINATE 5 MG PO TABS: 5.0000 mg | ORAL_TABLET | Freq: Every day | ORAL | Status: DC

## 2011-02-21 NOTE — Progress Notes (Signed)
  Subjective:    Patient ID: [name] [name], male    DOB: 1925/08/17, 77 y.o.   MRN: [id number]  HPI Patient here for followup urine urgency. He had nocturia 4-5 times per night and we started VESIcare 5 mg at night. Now getting up about once at night. Also previously noted some leakage problems but none since starting VESIcare. Mild dry mouth but no constipation. Overall tolerating very well. Feels more rested with improved sleep quality. He has no history of bowel obstruction or glaucoma. No significant obstructive urinary symptoms.   Review of Systems  Constitutional: Negative for fever and chills.  Respiratory: Negative for shortness of breath.   Cardiovascular: Negative for chest pain.  Genitourinary: Negative for dysuria, urgency, hematuria and difficulty urinating.       Objective:   Physical Exam  Constitutional: He is oriented to person, place, and time. He appears well-developed and well-nourished.  Cardiovascular: Normal rate and regular rhythm.   Pulmonary/Chest: Effort normal and breath sounds normal. No respiratory distress. He has no wheezes. He has no rales.  Musculoskeletal: He exhibits no edema.  Neurological: He is alert and oriented to person, place, and time.          Assessment & Plan:  Urinary urgency greatly improved. Prescription given for VESIcare 5 mg each bedtime.

## 2011-03-08 NOTE — Cardiovascular Report (Signed)
[place]  Patient:    [name] [name], [name] [name]                      MRN: [id number] Proc. Date: 08/12/00 Adm. Date:  [id number] Attending:  Ronaldo Miyamoto CC:         CV Laboratory  Rosalyn Gess. Norins, M.D. [facility]   Cardiac Catheterization  INDICATIONS:  [name] [name] is a pleasant 78 year old well-known to me.  He has a history of coronary artery disease.  He has had a prior internal mammary to left anterior descending artery.  He has done well, but has had some atypical symptoms.  An exercise Cardiolite was done and revealed no evidence of exercise-induced perfusion defect, but more marked ST depression than on previous studies.  Based upon this, we elected to recommend cardiac catheterization.  PROCEDURE: 1. Left heart catheterization. 2. Selective coronary arteriography. 3. Selective left ventriculography. 4. Selective left internal mammary angiography. 5. Distal aortography.  DESCRIPTION OF PROCEDURE:  The procedure was performed from the right femoral artery using 6 French catheters.  He tolerated the procedure without complications.  Subclavian angiography and distal aortography were also performed.  He was taken to the holding area in satisfactory condition.  HEMODYNAMIC DATA:  The central aortic pressure was 134/63, LV pressure 134/11. There was no gradient on pullback across the aortic valve.  ANGIOGRAPHIC DATA:  On plain ventriculography, ejection fraction was estimated to be in excess of 60%.  No segmental abnormalities or contraction were identified.  There was trace mitral regurgitation.  Distal aortography revealed bilaterally patent renal arteries.  No obvious aneurysm was noted in the infrarenal section of the aorta.  On plain fluoroscopy there is evidence of calcification of the coronaries both involving the left main, LAD, and circumflex.  The LAD is totally occluded beyond the origin of the septal and diagonal.  The diagonal itself has 90% narrowing, fairly similar to the previous study prior to revascularization.  This was diffusely diseased proximally.  The left internal mammary artery to the LAD appears to be widely patent.  The graft itself inserts in the LAD just beyond the takeoff of the diagonal and fills the diagonal retrograde and the LAD antegrade.  There is mild luminal irregularity of both vessels but no critical lesions in either.  The circumflex provides an insignificant first marginal and an AV circumflex. It terminates as a large marginal branch which bifurcates distally.  The superior subbranch of the bifurcation has about 75 to 80% stenosis at the ostium with some scattered irregularity of the midvessel beyond this portion. This vessel was small to moderate in size and is about a 2 mm artery.  The right coronary artery demonstrates about 20 to 30% generalized plaquing in the midvessel, but no other high grade lesions.  It is a dominant vessel providing both the posterior descending and two posterolateral branches.  CONCLUSION: 1. Normal left ventricular function. 2. Continued patency of the left internal mammary artery to the left anterior    descending. 3. Mild progression of disease in the circumflex marginal bifurcation as    noted above. 4. Continued small branch stenosis of the small first diagonal.  DISPOSITION:  Based upon the above findings, we would recommend better blood pressure control.  He will be able to be treated medically.  I will see him back in two weeks and longterm follow-up will be Dr. Debby Bud.  Continuation of Lipitor would be recommended. DD:  08/12/00 TD:  08/12/00 Job: [id number]

## 2011-03-08 NOTE — Assessment & Plan Note (Signed)
[facility]                           PRIMARY CARE OFFICE NOTE   NAME:[name] [name] HARBAUGH                      MRN:          [id number]  DATE:10/22/2006                            DOB:          12/29/1924    [name] [name] is a 77 year old gentleman well-known to the practice,  followed for multiple problems, who presented for follow-up evaluation  and exam.  The patient was last seen by Gregary Signs A. Everardo All, MD, May 26, 2006, for adjustment of his medications.  Last seen in primary care for  general wellness October 08, 2005.   CHIEF COMPLAINTS:  1. GI:  The patient reports he is having consistent postprandial      burning in the epigastrium to sternum despite being on a proton      pump inhibitor.  2. GU:  The patient complains of nocturia x2-3.  He has been tried on      Flomax in the past with disappointing results.  He has been on      finasteride 5 mg daily for several months with no improvement in      symptoms, and he discontinued medications 2 months ago.  3. GI:  Change in bowel habit.  The patient reports that his stools      are harder with  more constipation.  He has been taking a stool      softener.  4. Neurologic:  Patient with ongoing problem with balance.  5. Peripheral neuropathy:  Ongoing problems with the patient with mild      pins and needles but not to the extent of disturbing his sleep or      limiting his activities.   PAST MEDICAL HISTORY:  Surgical:  1. Bypass surgery in 1993 with a LIMA to the LAD.  2. Excision of cyst of the chest wall, sebaceous in nature, April      2004, Timothy E. Earlene Plater, M.D.   No other surgeries are reported.   Medical illnesses:  1. Insulin-dependent diabetes.  2. Coronary artery disease.  3. Hyperlipidemia.  4. Peripheral sensory neuropathy.  5. Retinopathy.  6. Nephropathy.  7. Visual changes with macular degeneration.  He is status post a      laser photocoagulation.  The patient notes at  this point he has      great difficulty reading.   FAMILY HISTORY:  Noncontributory.   SOCIAL HISTORY:  The patient is happily married.  He has 4 children, 10  grandchildren, 1 great-grandchild, and he has a well-knit, close family,  very supportive, and they gather on an annual basis at least for the  Christmas holidays.   PHYSICIAN ROSTER:  1. Arturo Morton. Riley Kill, MD, for cardiology.  2. Sean A. Everardo All, MD, for diabetology.  3. Guadelupe Sabin, MD, for ophthalmology.  4. Timothy E. Earlene Plater, MD, general surgery.   CURRENT MEDICATIONS:  1. Lasix 20 mg daily.  2. Glucophage 1000 mg b.i.d.  3. B12 1000 mg daily.  4. Aspirin 81 mg daily.  5. Diovan/HCT 160/12.5 mg daily.  6. Lipitor 80 mg daily.  7. AcipHex 20  mg daily.  8. Insulin 70/30 20 units q.a.m., 25 units q.p.m.   REVIEW OF SYSTEMS:  The patient has had no fevers, sweats, chills.  His  weight has been stable.  OPHTHALMOLOGIC:  Per Dr. Cecilie Kicks with the  patient having had an exam October 2007 with next exam scheduled for  March 2008.  The patient does have some dental work that needs to be  done which he is postponing at this time.  He does not have any dental  pain and he is able to eat.  CARDIOVASCULAR:  Stable with no significant  exercise-related discomfort, no significant change in exercise  tolerance.  Last cardiac catheterization was 2001.  Last stress study  was September 2001.  The patient has no respiratory complaints.  The  patient has rare heartburn, which is described above, not responding to  proton pump inhibitor.  The patient does have ongoing nocturia x2-3.  Patient with mild hand stiffness but no significant arthritic discomfort  or limitations in activity.  The patient continues to have mild  peripheral neuropathy.  ENDOCRINE:  Stable per Dr. Romero Belling.   PHYSICAL EXAMINATION:  VITAL SIGNS:  Temperature was 97.8, blood  pressure 162/77, pulse 82, weight 215.  GENERAL APPEARANCE:  This is a  well-nourished, heavy-set Caucasian male  in no acute distress.  HEENT:  Normocephalic, atraumatic.  EACs with cerumen but no impaction,  with a visualized TM.  Oropharynx:  The patient does have an upper  denture, native dentition on the mandible.  No buccal lesions were  noted.  Posterior pharynx was clear.  Palate was normal.  Conjunctivae  and sclerae were clear.  Pupils equal, round, and reactive.  Funduscopic  exam deferred to ophthalmology.  NECK:  Supple without thyromegaly.  NODES:  No adenopathy was noted in the cervical, supraclavicular or  axillary regions.  CHEST:  No CVA tenderness.  LUNGS:  Clear with no rales, wheezes or rhonchi.  CARDIOVASCULAR:  2+ radial pulse, no JVD, no carotid bruits.  He had a  quiet precordium with a regular rate and rhythm with no murmurs, rubs or  gallops appreciated.  ABDOMEN:  Protuberant, soft.  No guarding, no rebound, no  organosplenomegaly was appreciated.  GENITALIA:  Normal.  RECTAL:  Normal sphincter tone.  Prostate is smooth and normal in size  and contour without nodules.  EXTREMITIES:  Without clubbing, cyanosis, or edema.  No deformities were  noted.  NEUROLOGIC:  The patient was awake, alert and oriented to person, place,  time and context.  Cranial nerves II-XII were grossly intact except for  visual acuity, which was limited by difficulty with reading.  Motor  strength was 5/5 and normal.  Cerebellar function appeared normal.  Sensation was decreased to light touch, pinprick and vibratory sensation  in both feet.  DERMATOLOGIC:  The patient had old scarring at the site of his  sternotomy.  The patient's feet were inspected.  There were no lesions  on the plantar aspects.  No other significant lesions were noted.   LABORATORY DATA:  TSH was normal at 2.14.  Potassium was normal at 4.5.  PSA was normal at 0.12.  Cholesterol was 143, triglycerides 166, HDL was 36.1 and LDL was 74.  Creatinine was normal at 1.0.  SGOT, SGPT  were  normal.  Hemoglobin A1c was 7.1%.   ASSESSMENT AND PLAN:  1. Gastrointestinal:  Patient with persistent postprandial      gastroesophageal reflux disease despite being on AcipHex.  The  patient has not had GI consultation for upper symptoms for several      years.  Plan:  Patient scheduled to see Ulyess Mort, MD,      January 10 at 3 p.m., and he is aware of this appointment.  2. Genitourinary:  Patient with uncontrolled nocturia.  He has failed      both finasteride and Flomax.  Plan:  Trial of Vesicare.  If the      patient does not see improvement in urinary frequency and nocturia      after 7 days of sample, would then consider a referral to urology      for further evaluation.  3. Bowel habit change:  The patient had a colonoscopy Feb 21, 2005,      which showed colon polyps.  He has a 3-year follow-back.  Suspect      the patient needs a bulk laxative such as Metamucil, Fiber-Con,      Benefiber, etc., and I have recommended this.  4. Balance problems:  The patient's last evaluation was also positive      for balance problems.  I do not have any evidence of neuro imaging      on the chart in this volume.  The patient did have carotid Dopplers      performed February 17, 2004, which showed a 0-39% right internal      carotid artery stenosis, 60-79% left internal carotid artery      stenosis.  Plan:  The patient is to exercise caution with position      change.  If he continues to have problems, he would be a candidate      for follow-up for repeat carotid studies and, if negative, followed      with neuro imaging.  5. Coronary artery disease:  The patient has been stable with no pain      or discomfort.  He has not had an evaluation since 2001.  Plan:      The patient is scheduled for a stress-only nuclear study December 02, 2006, at 12:30 p.m.  He is aware of this study date.  Will      consider a referral back to cardiology based on results.  6. Peripheral  neuropathy:  The patient has had a mild peripheral      neuropathy, of which he is aware.  He does check his feet on a      regular basis.  7. Health maintenance:  The patient has headache colonoscopy as noted.      PSA and prostate exam are normal.  Laboratory is stable.  8. Ophthalmologic:  The patient continues to follow with Dr. Cecilie Kicks      in regard to his vision secondary to diabetic retinopathy.  9. Diabetes:  The patient is stable with hemoglobin A1c of 7.1%,      unchanged from August 2007.  The patient will follow up with Dr.      Romero Belling as instructed.   In summary, this is a pleasant gentleman with multiple medical problems,  who does seem medically stable at this time.  He will return to see me  in 6 months or on an as-needed basis.     Rosalyn Gess Norins, MD  Electronically Signed    MEN/MedQ  DD: 10/23/2006  DT: 10/23/2006  Job #: [id number]   cc:   Arturo Morton. Riley Kill, MD, [facility]  [place]

## 2011-03-08 NOTE — H&P (Signed)
[place]. [facility]  Patient:    [name] [name], [name] [name]                      MRN: [id number] Adm. Date:  [id number] Disc. Date: [id number] Attending:  Ivor Messier CC:         Rosalyn Gess. Norins, M.D. [facility]   History and Physical  INDICATIONS:  This was a planned outpatient surgical admission of this 77 year old white male admitted for cataract implant surgery of the right eye.  HISTORY OF PRESENT ILLNESS:  This patient has a past history of non-insulin-dependent diabetes mellitus with secondary complications of diabetic retinopathy.  The patient has been a previous patient of Dr. Michel Harrow and Dr. Casper Harrison of the [facility]  He has had previous laser photocoagulation to both eyes.  Recently, the patient has been seen by Dr. Jearld Adjutant of [place].  The patient has had a history of background diabetic retinopathy with clinically significant macular edema and progressive cataract formation in both eyes.  Recently, the vision has deteriorated to less than 20/400 in the right eye and 20/400 in the left eye. The patient has elected to proceed with cataract implant surgery hoping for improvement of vision but understanding the underlying diabetic retinopathy disease.  He has signed an informed consent and arrangements were made for his outpatient admission at this time.  PAST MEDICAL HISTORY:  The patient is under the care of Dr. Wyonia Hough of [place].  The patient currently is taking multiple medications under his direction including Glucophage and ______ and Actos for blood sugar control.  REVIEW OF SYSTEMS:  No cardiorespiratory complaints.  PHYSICAL EXAMINATION:  VITAL SIGNS:  As recorded on admission.  Blood pressure 140/71, temperature 98, pulse 77, respirations 20.  GENERAL:  The patient is an alert, well-nourished, well-developed 77 year old white male with limited vision.  HEENT:  Eyes:  Visual acuity as noted above.   Applanation tonometry 16 mm right eye, 14 left eye.  External ocular and slitlamp examination:  The eyes are white and clear with nuclear cataract formation in both eyes, greater in the right than left eye.  Fundus examination reveals a clear vitreous, attached retina, with old background diabetic retinopathy and laser focal scars at the posterior retina of both eyes.  CHEST:  Lungs clear to percussion and auscultation.  HEART:  Normal sinus rhythm.  No cardiomegaly, no murmurs.  ABDOMEN:  Negative.  EXTREMITIES:  Negative.  ADMISSION DIAGNOSIS:  Senile cataract both eyes.  Diabetic background retinopathy both eyes.  Diabetes mellitus, non-insulin dependent type.  SURGICAL PLAN:  Cataract implant surgery right eye now, possibly left eye later. DD:  01/31/01 TD:  01/31/01 Job: [id number]

## 2011-03-08 NOTE — Assessment & Plan Note (Signed)
[facility]                         GASTROENTEROLOGY OFFICE NOTE   NAME:Riddell, EGOR FULLILOVE                      MRN:          [id number]  DATE:10/30/2006                            DOB:          [birth year]    The patient comes in on January 10.  He has been having some refractory  GERD especially for the past 4 months, a scratchy feeling with solid  food going down.  No real dysphagia to liquids or solids.  The patient  said he smoked a pipe until he had heart surgery 15 years ago and now  has not smoked since.  He has a brother recently diagnosed with throat  cancer, and I think he is anxious about this.  We have done a number of  colonoscopic examinations on him but never upper endoscopy.   MEDICATIONS:  Glucophage for his diabetes, vitamins, Lipitor for  hyperlipidemia.  He was on AcipHex for GERD.  Metformin and insulin.   PHYSICAL EXAMINATION:  VITAL SIGNS:  Weighs 210 pounds.  Blood pressure  132/64, pulse 65 and regular.  NECK:  Unremarkable.  EXTREMITIES:  Unremarkable.   IMPRESSION:  1. Gastroesophageal reflux disease.  2. Insulin-dependent diabetes.  3. Arteriosclerotic cardiovascular disease.  4. History of hyperlipidemia.  5. Nephropathy.  6. Retinopathy with macular degeneration.  7. Peripheral sensory neuropathy.   RECOMMENDATIONS:  Schedule him for an upper endoscopy.  In the meantime,  I put him on a proton pump inhibitor, gave him some Prevacid to take one  or two daily as needed to see if we can get his symptoms under control  in this manner.  I think there is a great deal of anxiety about his  brother, and I think having the endoscopy will be certainly helpful to  him in reassuring if all is well, which I suspect it will be.     Ulyess Mort, MD  Electronically Signed    SML/MedQ  DD: 10/30/2006  DT: 10/31/2006  Job #: [id number]

## 2011-03-08 NOTE — H&P (Signed)
[place]. [facility]  Patient:    [name] [name], [name] [name] Visit Number: [id number] MRN: [id number]          Service Type: DSU Location: [facility] 2899 [id number] Attending Physician:  Ivor Messier Dictated by:   Guadelupe Sabin, M.D. Admit Date:  07/24/2001   CC:         Rosalyn Gess. Norins, M.D. [facility]   History and Physical  This was a planned outpatient surgical readmission of this 77 year old white male insulin-dependent diabetic admitted for cataract implant surgery of the left eye.  HISTORY OF PRESENT ILLNESS:  This patient has been noted to have diabetes mellitus and secondary complications of diabetic retinopathy background type and progressive cataract formation in both eyes.  Patient has undergone previous laser photocoagulation and was previously admitted on January 30, 2001 for cataract implant surgery of the right eye.  He is now admitted for similar surgery of the left eye.  PAST MEDICAL HISTORY:  Patient continues under the care of his regular physician, Rosalyn Gess. Norins, M.D.  Patient is felt to be in good stable general health taking several medications.  MEDICATIONS:  Lasix, Actos, Amaryl, and other medications.  He does not appear to be taking insulin at the present time.  ALLERGIES:  None known.  REVIEW OF SYSTEMS:  No cardiorespiratory complaints.  PHYSICAL EXAMINATION  VITAL SIGNS:  Blood pressure 122/63, pulse 73, respirations 16, temperature 97.1.  GENERAL:  Patient is a pleasant, well-nourished, well-developed 77 year old white male in no acute distress.  HEENT:  Eyes:  Visual acuity with best correction less than 20/400 right eye, 20/400 left eye.  Applanation tonometry 16 mm left eye.  Slit lamp examination: The eyes are white and clear with a clear posterior chamber intraocular lens implant in the right eye and nuclear cataract formation in the left eye.  Applanation tonometry 14 mm right eye, 16 left eye.  Detailed fundus  examination reveals a clear vitreous, attached retina with old pan retinal laser photocoagulation.  The blood vessels reveal old diabetic retinopathy.  There are some yellow-white exudates noted in the macula of the right eye and left eye.  CHEST:  Lungs clear to percussion, auscultation.  HEART:  Normal sinus rhythm.  No cardiomegaly.  No murmurs.  ABDOMEN:  Negative.  EXTREMITIES:  Negative.  ADMISSION DIAGNOSES:  Senile nuclear cataract left eye, ______ right eye, proliferative and background diabetic retinopathy, ______ left eye following previous cataract implant surgery.  SURGICAL PLAN:  Cataract implant surgery left eye at this time. Dictated by:   Guadelupe Sabin, M.D. Attending Physician:  Ivor Messier DD:  07/24/01 TD:  07/24/01 Job: [id number]

## 2011-03-08 NOTE — Op Note (Signed)
[name] [name], [name] [name]                         ACCOUNT NO.:  [id number]   MEDICAL RECORD NO.:  [id number]                   PATIENT TYPE:  AMB   LOCATION:  DAY                                  FACILITY:  [facility]   PHYSICIAN:  Timothy E. Earlene Plater, M.D.              DATE OF BIRTH:  [birth year]   DATE OF PROCEDURE:  01/20/2003  DATE OF DISCHARGE:                                 OPERATIVE REPORT   PREOPERATIVE DIAGNOSES:  1. Chronic sebaceous cyst with abscess, mid chest.  2. Chronic sebaceous cyst, right chest.   POSTOPERATIVE DIAGNOSES:  1. Chronic sebaceous cyst with abscess, mid chest.  2. Chronic sebaceous cyst, right chest.   PROCEDURE:  Excision of cysts of the chest wall.   SURGEON:  Timothy E. Earlene Plater, M.D.   ANESTHESIA:  Local standby.   INDICATIONS FOR PROCEDURE:  [name] [name] is 57, does have significant heart  disease and diabetes.  He has had multiple sebaceous cysts known.  The one  on the anterior chest just to the left of the sternotomy scar has been  ruptured, infected, and abscessed for the past four weeks.  We have treated  that conservatively with incision and drainage and antibiotics.  It has now  effervesced and ready for a complete excision.  He also has a smaller  chronic cyst on the skin on the right breast.  He wants all of these removed  today, and we will do so.   DESCRIPTION OF PROCEDURE:  He evaluated by anesthesia, taken to the  operating room, placed supine, IV sedation given.  The entire chest was  prepped and draped in the usual fashion.  Marcaine 25% with epinephrine and  1% plain Xylocaine was used for local anesthesia.  A generous incision was  made over the cyst of the left mid chest, and then it was lengthened to a  total of 5.5 cm.  I ellipsed out the previous I&D site and then undermined  and removed the entire cyst and scar tissue.  This left quite a cavity.  Bleeding was controlled with the cautery.  A 7 mm drain was placed there,  cut to fit,  and sutured to the skin.  Closure of the wound was accomplished  with 2-0 retention sutures of 3-0 nylon sutures.  The sebaceous cyst on the  right chest was incised simply; cautery was used, and the skin was closed  with 3-0 nylon.  He tolerated it well, was removed to the recovery room in  good condition.  Written and verbal instructions were given him and his  wife, including Darvocet-N #24, refill 1.  He to finish his Keflex and  continue usual medications, and I will see him in three days in the office.  Timothy E. Earlene Plater, M.D.    TED/MEDQ  D:  01/20/2003  T:  01/20/2003  Job:  [id number]   cc:   Rosalyn Gess. Norins, M.D. [facility]

## 2011-03-08 NOTE — Op Note (Signed)
[place]. [facility]  Patient:    [name] [name], [name] [name]                      MRN: [id number] Proc. Date: 01/30/01 Adm. Date:  [id number] Disc. Date: [id number] Attending:  Ivor Messier CC:         Rosalyn Gess. Norins, M.D. [facility]   Operative Report  PREOPERATIVE DIAGNOSES: 1. Nuclear cataract right eye. 2. Diabetic retinopathy right eye.  SECONDARY DIAGNOSIS:  Diabetes mellitus, non-insulin dependent type.  SURGEON:  Guadelupe Sabin, M.D.  ASSISTANT:  Nurse.  ANESTHESIA:  Local 4% Xylocaine, 0.75 Marcaine, retrobulbar block, topical tetracaine, intraocular Xylocaine.  OPERATIVE PROCEDURE:  After the patient was prepped and draped, a speculum was inserted in the right eye.  The eye was turned downward and a superior rectus traction suture placed.  Schiotz tonometry was recorded at 7 scale units with a 5.5 g weight.  A peritomy was performed adjacent to the limbus from the 11 to 1 oclock position.  A corneoscleral groove was made with a 45-degree Superblade.  The anterior chamber was then entered with the 2.5 mm diamond keratome at the 12 oclock position and the 15-degree blade at the 2:30 position.  Using a bent 26-gauge needle on a Healon syringe, a circular capsulorrhexis was begun and then completed with the Gradle forceps. Hydrodissection and hydrodelineation were performed using 1% Xylocaine.  The 30-degree phacoemulsification tip was then inserted with slow controlled emulsification of the lens nucleus.  Total ultrasonic time 50 seconds. Average power level 19%.  Total amount of fluid used 75 cc.  Following removal of the nucleus, the residual cortex was aspirated with the irrigation aspiration tip.  The posterior capsule appeared intact with a brilliant red fundus reflex.  It was therefore elected to insert a [employer] model EZE-60 polymethyl methacrylate lens.  Diopter strength +22.00.  This was inserted with the Shepard forceps  into the anterior chamber and then centered into the capsular bag using the [facility] lens rotator.  The lens appeared to be well centered.  The Healon, which had been used during the procedure, was aspirated and replaced with balanced salt solution and Miochol ophthalmic solution.  Due to the extended incision to allow the insertion of the 6 mm intraocular lens implant, three 10-0 interrupted radial sutures were placed across the incision at the 12 oclock position.  Maxitrol ointment was instilled in the conjunctival cul-de-sac and a light patch and protector shield applied.  Duration of procedure and anesthesia administration 45 minutes.  The patient tolerated the procedure well in general and left the operating room for the recovery room in good condition. DD:  01/31/01 TD:  01/31/01 Job: [id number]

## 2011-03-08 NOTE — Op Note (Signed)
[place]. [facility]  Patient:    [name] [name], [name] [name] Visit Number: [id number] MRN: [id number]          Service Type: DSU Location: [facility] 28[address] 36 Attending Physician:  Ivor Messier Dictated by:   Guadelupe Sabin, M.D. Proc. Date: 07/24/01 Admit Date:  07/24/2001                             Operative Report  PREOPERATIVE DIAGNOSES:  Nuclear cataract right eye, background-proliferative retinopathy, diabetic retinopathy right eye.  POSTOPERATIVE DIAGNOSES:  Nuclear cataract right eye, background-proliferative retinopathy, diabetic retinopathy right eye.  NAME OF OPERATION:  Planned extracapsular cataract extraction, phacoemulsification, primary insertion of posterior chamber intraocular lens implant.  SURGEON:  Guadelupe Sabin, M.D.  ASSISTANT:  Nurse  ANESTHESIA:  Local 4% Xylocaine, 0.75% Marcaine, retrobulbar block, topical tetracaine, intraocular Xylocaine.  Patient given sodium pentothal intravenously during the period of retrobulbar injection.  OPERATIVE PROCEDURE:  After the patient was prepped and draped a lid speculum was inserted in the left eye.  Schiotz tonometry was recorded at 5-6 scale units with a 5.5 g weight.  A peritomy was performed adjacent to the limbus from the 11 to 1 oclock position.  The corneoscleral junction was cleaned and a corneoscleral groove made with a 45 degree Superblade.  The anterior chamber was then entered with the 2.5 mm diamond keratome at the 12 oclock position and the 15 degree blade at the 2:30 position.  Using a bent 26 gauge needle on a Healon syringe a circular capsulorrhexis was begun and then completed with the Grabow forceps.  Hydrodissection and hydrodelineation were performed using 1% Xylocaine.  A 30 degree phacoemulsification tip was then inserted with slow controlled emulsification of the lens nucleus.  Total ultrasonic time 2 minutes 13 seconds.  Average power level 12%.  Total amount  of fluid used 150 cc.  Following removal of the nucleus the residual cortex was aspirated with the irrigation aspiration tip.  The posterior capsule appeared intact with a brilliant red fundus reflex.  It was therefore elected to insert in this diabetic patient a ______ posterior chamber intraocular lens implant model EZE-60, diopter power +23.00, length 12.75, optic size 6.00 mm. The incision was opened to 6 mm width and the ______ forceps used to place the intraocular lens implant in the anterior chamber.  It was then centered into the capsular bag using the [facility] lens rotator.  The lens appeared to be well centered. The Healon which had been used during the procedure was aspirated and replaced with balanced salt solution and Miochol ophthalmic solution.  The operative incision, due to its increased length of 6 mm, was sutured with three 10-0 interrupted nylon sutures.  The side port incision appeared to be self sealing and no sutures were required.  The conjunctiva was brushed forward over the operative incision.  Maxitrol ointment was instilled in the conjunctival cul-de-sac and a light patch and protector shield applied. Duration of procedure and anesthesia administration:  45 minutes.  Patient tolerated the procedure well in general, left the operating room for the recovery room in good condition.Dictated by:   Guadelupe Sabin, M.D. Attending Physician:  Ivor Messier DD:  07/24/01 TD:  07/24/01 Job: [id number]

## 2011-07-19 ENCOUNTER — Ambulatory Visit: Payer: Medicare Other | Admitting: Family Medicine

## 2011-07-26 ENCOUNTER — Ambulatory Visit (INDEPENDENT_AMBULATORY_CARE_PROVIDER_SITE_OTHER): Payer: Medicare Other | Admitting: Family Medicine

## 2011-07-26 ENCOUNTER — Encounter: Payer: Self-pay | Admitting: Family Medicine

## 2011-07-26 DIAGNOSIS — E785 Hyperlipidemia, unspecified: Secondary | ICD-10-CM

## 2011-07-26 DIAGNOSIS — E119 Type 2 diabetes mellitus without complications: Secondary | ICD-10-CM

## 2011-07-26 DIAGNOSIS — Z9181 History of falling: Secondary | ICD-10-CM

## 2011-07-26 DIAGNOSIS — E1129 Type 2 diabetes mellitus with other diabetic kidney complication: Secondary | ICD-10-CM

## 2011-07-26 DIAGNOSIS — I10 Essential (primary) hypertension: Secondary | ICD-10-CM

## 2011-07-26 DIAGNOSIS — R3915 Urgency of urination: Secondary | ICD-10-CM

## 2011-07-26 LAB — BASIC METABOLIC PANEL
CO2: 26 mEq/L (ref 19–32)
Calcium: 9.3 mg/dL (ref 8.4–10.5)
Chloride: 102 mEq/L (ref 96–112)
Glucose, Bld: 119 mg/dL — ABNORMAL HIGH (ref 70–99)
Potassium: 4.1 mEq/L (ref 3.5–5.1)
Sodium: 139 mEq/L (ref 135–145)

## 2011-07-26 MED ORDER — SOLIFENACIN SUCCINATE 5 MG PO TABS: 5.0000 mg | ORAL_TABLET | Freq: Every day | ORAL | Status: DC

## 2011-07-26 NOTE — Progress Notes (Signed)
  Subjective:    Patient ID: [name] [name], male    DOB: November 04, 1924, 77 y.o.   MRN: [id number]  HPI Medical followup. Urine urgency. Some improvement with VESIcare but still some breakthrough symptoms. No obstructive symptoms. Also takes Proscar.  No burning with urination.  Type 2 diabetes. Stable by home readings. No hypoglycemia. Needs repeat A1c. Eye exam next week. Compliant with medications.  History of CAD. No recent chest pain. Hypertension which has been stable. No orthostasis.  Increased risk of falls. Diabetic neuropathy.  Diabetic retinopathy.. Osteoarthritis. Denies recent falls. Uses cane inconsistently.   Review of Systems  Constitutional: Negative for fever and chills.  Eyes: Negative for visual disturbance.  Respiratory: Negative for cough and shortness of breath.   Cardiovascular: Negative for chest pain, palpitations and leg swelling.  Gastrointestinal: Negative for abdominal pain.  Genitourinary: Positive for urgency.       Objective:   Physical Exam  Constitutional: He is oriented to person, place, and time. He appears well-developed and well-nourished.  HENT:  Right Ear: External ear normal.  Left Ear: External ear normal.  Mouth/Throat: Oropharynx is clear and moist.  Neck: Neck supple.  Cardiovascular: Normal rate and regular rhythm.   Pulmonary/Chest: Effort normal and breath sounds normal. No respiratory distress. He has no wheezes. He has no rales.  Musculoskeletal: He exhibits no edema.  Lymphadenopathy:    He has no cervical adenopathy.  Neurological: He is alert and oriented to person, place, and time. No cranial nerve deficit.          Assessment & Plan:  #1 type 2 diabetes. Reassess A1c #2 hypertension. Stable. Check basic metabolic panel  #3 urine urgency. Refill of VESIcare for one year #4 high risk of falls. Discussed possible referral for fall prevention PT program. Use cane more consistently #5 health maintenance. Flu vaccine  given.

## 2011-07-30 NOTE — Progress Notes (Signed)
Quick Note:  Pt informed on home VM ______ 

## 2011-08-01 DIAGNOSIS — E11319 Type 2 diabetes mellitus with unspecified diabetic retinopathy without macular edema: Secondary | ICD-10-CM | POA: Insufficient documentation

## 2011-08-01 DIAGNOSIS — H31019 Macula scars of posterior pole (postinflammatory) (post-traumatic), unspecified eye: Secondary | ICD-10-CM | POA: Insufficient documentation

## 2011-11-01 ENCOUNTER — Other Ambulatory Visit: Payer: Self-pay | Admitting: *Deleted

## 2011-11-01 MED ORDER — SIMVASTATIN 80 MG PO TABS: 80.0000 mg | ORAL_TABLET | Freq: Every day | ORAL | Status: DC

## 2011-11-01 MED ORDER — LISINOPRIL 20 MG PO TABS: 20.0000 mg | ORAL_TABLET | Freq: Every day | ORAL | Status: DC

## 2011-11-01 MED ORDER — FUROSEMIDE 20 MG PO TABS: 20.0000 mg | ORAL_TABLET | Freq: Every day | ORAL | Status: DC

## 2011-11-01 MED ORDER — INSULIN ASPART PROT & ASPART (70-30 MIX) 100 UNIT/ML ~~LOC~~ SUSP: SUBCUTANEOUS | Status: DC

## 2011-11-08 ENCOUNTER — Other Ambulatory Visit: Payer: Self-pay | Admitting: *Deleted

## 2011-11-08 ENCOUNTER — Telehealth: Payer: Self-pay | Admitting: Family Medicine

## 2011-11-08 MED ORDER — INSULIN ASPART PROT & ASPART (70-30 MIX) 100 UNIT/ML ~~LOC~~ SUSP: SUBCUTANEOUS | Status: DC

## 2011-11-08 NOTE — Telephone Encounter (Signed)
Pt called and said that [facility] only sent pt 1 bottle of insulin. Pt needs pcp to contack [facility] and order a 90 day supply of insulin aspart protamine-insulin aspart (NOVOLOG 70/30) (70-30) 100 UNIT/ML injection their Phone # is [phone]

## 2011-11-11 ENCOUNTER — Other Ambulatory Visit: Payer: Self-pay | Admitting: *Deleted

## 2011-11-11 MED ORDER — SIMVASTATIN 80 MG PO TABS: 80.0000 mg | ORAL_TABLET | Freq: Every day | ORAL | Status: DC

## 2011-11-11 MED ORDER — INSULIN ASPART PROT & ASPART (70-30 MIX) 100 UNIT/ML ~~LOC~~ SUSP: SUBCUTANEOUS | Status: DC

## 2011-11-15 ENCOUNTER — Telehealth: Payer: Self-pay | Admitting: Family Medicine

## 2011-11-15 NOTE — Telephone Encounter (Signed)
Chart opened in error. Thanks.

## 2011-11-28 ENCOUNTER — Ambulatory Visit (INDEPENDENT_AMBULATORY_CARE_PROVIDER_SITE_OTHER): Payer: Medicare Other | Admitting: Family Medicine

## 2011-11-28 ENCOUNTER — Encounter: Payer: Self-pay | Admitting: Family Medicine

## 2011-11-28 DIAGNOSIS — I10 Essential (primary) hypertension: Secondary | ICD-10-CM

## 2011-11-28 DIAGNOSIS — E119 Type 2 diabetes mellitus without complications: Secondary | ICD-10-CM

## 2011-11-28 DIAGNOSIS — E785 Hyperlipidemia, unspecified: Secondary | ICD-10-CM

## 2011-11-28 DIAGNOSIS — Z87898 Personal history of other specified conditions: Secondary | ICD-10-CM

## 2011-11-28 LAB — LIPID PANEL
Cholesterol: 125 mg/dL (ref 0–200)
LDL Cholesterol: 51 mg/dL (ref 0–99)

## 2011-11-28 LAB — BASIC METABOLIC PANEL
BUN: 25 mg/dL — ABNORMAL HIGH (ref 6–23)
Calcium: 9.7 mg/dL (ref 8.4–10.5)
Creatinine, Ser: 1 mg/dL (ref 0.4–1.5)
GFR: 73.91 mL/min (ref >60.00)

## 2011-11-28 LAB — HEPATIC FUNCTION PANEL
ALT: 11 U/L (ref 0–53)
AST: 16 U/L (ref 0–37)
Alkaline Phosphatase: 53 U/L (ref 39–117)
Bilirubin, Direct: 0 mg/dL (ref 0.0–0.3)
Total Protein: 7.6 g/dL (ref 6.0–8.3)

## 2011-11-28 NOTE — Progress Notes (Signed)
  Subjective:    Patient ID: [name] [name], male    DOB: 12/25/1924, 78 y.o.   MRN: [id number]  HPI  Medical followup. Patient has history of type 2 diabetes, CAD, hypertension, GERD, high risk for falls, and BPH. He has recently had some frequent sinus congestion. No purulent secretions. No fever. No headaches. Mostly has nasal stuffiness at night.    Blood sugar stable. A1c is well controlled. Receives yearly eye exams. He has some chronic visual changes.  Hyperlipidemia treated with simvastatin. No myalgias. Needs followup lab work. Denies recent chest pain.  One episode of bright red blood per rectum couple weeks ago. Noted after some straining. None since then. No consistent constipation. No pain with bowel movement. No appetite or weight changes. Multiple prior colonoscopies most recently about 5 or 6 years ago.  Past Medical History  Diagnosis Date  . AODM 02/24/2009  . NEPHROPATHY, DIABETIC 07/10/2007  . DIABETIC  RETINOPATHY 07/10/2007  . HYPERLIPIDEMIA 07/10/2007  . PERIPHERAL NEUROPATHY 07/10/2007  . ESSENTIAL HYPERTENSION 10/07/2007  . CORONARY ARTERY DISEASE 07/10/2007  . GERD 02/17/2008  . ATAXIA 02/17/2008  . CHEST PAIN 06/28/2008  . BENIGN PROSTATIC HYPERTROPHY, HX OF 02/13/2009  . RISK OF FALLING 01/09/2010   Past Surgical History  Procedure Date  . Esophagogastroduodenoscopy 2008  . Cad 1993    graft Lima Lad  . Irrigation and debridement sebaceous cyst   . Eye surgery     catarac  . Appendectomy   . Hernia repair     reports that he has quit smoking. His smoking use included Pipe. He does not have any smokeless tobacco history on file. His alcohol and drug histories not on file. family history is not on file. Allergies  Allergen Reactions  . Pioglitazone     REACTION: Edema      Review of Systems  Constitutional: Negative for appetite change, fatigue and unexpected weight change.  Eyes: Negative for visual disturbance.  Respiratory: Negative for cough,  chest tightness and shortness of breath.   Cardiovascular: Negative for chest pain, palpitations and leg swelling.  Gastrointestinal: Positive for blood in stool. Negative for nausea, vomiting, abdominal pain and diarrhea.  Genitourinary: Negative for dysuria.  Neurological: Negative for dizziness, syncope, weakness, light-headedness and headaches.       Objective:   Physical Exam  Constitutional: He is oriented to person, place, and time. He appears well-developed and well-nourished. No distress.  Cardiovascular: Normal rate and regular rhythm.   Pulmonary/Chest: Effort normal and breath sounds normal. No respiratory distress. He has no wheezes. He has no rales.  Musculoskeletal: He exhibits no edema.       Feet reveal no skin lesions. Good distal foot pulses. Good capillary refill. No calluses. Minimal impairment the bottoms of feet with monofilament testing   Neurological: He is alert and oriented to person, place, and time.  Psychiatric: He has a normal mood and affect. His behavior is normal.          Assessment & Plan:  #1 type 2 diabetes. History of good control. Recheck A1c #2 history of dyslipidemia. Check lipid and hepatic panel #3 hypertension adequate control. Continue current medications. Check basic metabolic panel  #4 rhinitis question allergic. Consider nasal steroid though patient wishes to observe this time

## 2011-12-20 ENCOUNTER — Other Ambulatory Visit: Payer: Self-pay | Admitting: *Deleted

## 2011-12-20 MED ORDER — METFORMIN HCL 1000 MG PO TABS: 1000.0000 mg | ORAL_TABLET | Freq: Two times a day (BID) | ORAL | Status: DC

## 2011-12-20 MED ORDER — FINASTERIDE 5 MG PO TABS: 5.0000 mg | ORAL_TABLET | Freq: Every day | ORAL | Status: DC

## 2012-01-02 ENCOUNTER — Other Ambulatory Visit: Payer: Self-pay | Admitting: *Deleted

## 2012-01-02 MED ORDER — METFORMIN HCL 1000 MG PO TABS: 1000.0000 mg | ORAL_TABLET | Freq: Two times a day (BID) | ORAL | Status: DC

## 2012-01-02 MED ORDER — FINASTERIDE 5 MG PO TABS: 5.0000 mg | ORAL_TABLET | Freq: Every day | ORAL | Status: DC

## 2012-03-18 ENCOUNTER — Telehealth: Payer: Self-pay | Admitting: Family Medicine

## 2012-03-18 NOTE — Telephone Encounter (Signed)
Copies mailed to pt

## 2012-03-18 NOTE — Telephone Encounter (Signed)
Pt called and is needing to get a copy of last labs and notes, mailed to patient home address, so that pt can take to his next appt he has at [facility] in [place]. Pls call pt if there are any questions.

## 2012-04-14 ENCOUNTER — Encounter: Payer: Self-pay | Admitting: Family Medicine

## 2012-04-14 ENCOUNTER — Ambulatory Visit (INDEPENDENT_AMBULATORY_CARE_PROVIDER_SITE_OTHER): Payer: Medicare Other | Admitting: Family Medicine

## 2012-04-14 VITALS — BP 140/70 | Temp 98.2°F | Wt 204.0 lb

## 2012-04-14 DIAGNOSIS — R3915 Urgency of urination: Secondary | ICD-10-CM

## 2012-04-14 DIAGNOSIS — E119 Type 2 diabetes mellitus without complications: Secondary | ICD-10-CM

## 2012-04-14 DIAGNOSIS — Z9181 History of falling: Secondary | ICD-10-CM

## 2012-04-14 NOTE — Patient Instructions (Addendum)
STOP Vesicare. Start Myrbetriq 25 mg one at night

## 2012-04-14 NOTE — Progress Notes (Signed)
Quick Note:  Pt informed ______ 

## 2012-04-14 NOTE — Progress Notes (Signed)
  Subjective:    Patient ID: [name] [name], male    DOB: [birth year], 78 y.o.   MRN: [id number]  HPI  Patient here for medical followup. History of type 2 diabetes, peripheral neuropathy, CAD, hypertension, hyperlipidemia, GERD, and urinary urgency.  Recent problems with increased urinary urgency. No burning with urination. On VESIcare which has not helped. Had dry mouth issues with this. He has history of BPH and takes finasteride. Denies any major obstructive issues. Frequent nocturia 3-4 times at night  Type 2 diabetes. Generally well-controlled fasting blood sugar from 120. No hypoglycemia. Last A1c 7.0%. Getting regular eye exams. No recent chest pain.  High risk for falls with neuropathy hx, arthritis, deconditioning, etc.   No recent fall.  Ambulates with cane most of time.  Has considered further physical therapy but not sure at this time.  Past Medical History  Diagnosis Date  . AODM 02/24/2009  . NEPHROPATHY, DIABETIC 07/10/2007  . DIABETIC  RETINOPATHY 07/10/2007  . HYPERLIPIDEMIA 07/10/2007  . PERIPHERAL NEUROPATHY 07/10/2007  . ESSENTIAL HYPERTENSION 10/07/2007  . CORONARY ARTERY DISEASE 07/10/2007  . GERD 02/17/2008  . ATAXIA 02/17/2008  . CHEST PAIN 06/28/2008  . BENIGN PROSTATIC HYPERTROPHY, HX OF 02/13/2009  . RISK OF FALLING 01/09/2010   Past Surgical History  Procedure Date  . Esophagogastroduodenoscopy 2008  . Cad 1993    graft Lima Lad  . Irrigation and debridement sebaceous cyst   . Eye surgery     catarac  . Appendectomy   . Hernia repair     reports that he has quit smoking. His smoking use included Pipe. He does not have any smokeless tobacco history on file. His alcohol and drug histories not on file. family history is not on file. Allergies  Allergen Reactions  . Pioglitazone     REACTION: Edema      Review of Systems  Constitutional: Negative for fever, chills and unexpected weight change.  HENT: Negative for trouble swallowing.   Respiratory:  Negative for cough and shortness of breath.   Cardiovascular: Negative for chest pain, palpitations and leg swelling.  Gastrointestinal: Negative for abdominal pain.  Genitourinary: Positive for urgency and frequency. Negative for dysuria and difficulty urinating.  Neurological: Negative for syncope and headaches.  Psychiatric/Behavioral: Negative for confusion.       Objective:   Physical Exam  Constitutional: He is oriented to person, place, and time. He appears well-developed and well-nourished.  HENT:  Right Ear: External ear normal.  Left Ear: External ear normal.  Mouth/Throat: Oropharynx is clear and moist.  Neck: Neck supple. No thyromegaly present.  Cardiovascular: Normal rate and regular rhythm.   Pulmonary/Chest: Effort normal and breath sounds normal. No respiratory distress. He has no wheezes. He has no rales.  Musculoskeletal: He exhibits no edema.       No foot lesions. No calluses. Impaired sensory function to touch  Neurological: He is alert and oriented to person, place, and time. No cranial nerve deficit.  Psychiatric: He has a normal mood and affect. His behavior is normal.          Assessment & Plan:  #1 type 2 diabetes. History of good control. Recheck A1c #2 urinary urgency. Discontinue VESIcare. Trial of myrbetriq 25 mg once daily #3 high risk of falls.  Discussed PT and at this time he wishes to observe.

## 2012-04-28 ENCOUNTER — Encounter (INDEPENDENT_AMBULATORY_CARE_PROVIDER_SITE_OTHER): Payer: Medicare Other

## 2012-04-28 ENCOUNTER — Ambulatory Visit (INDEPENDENT_AMBULATORY_CARE_PROVIDER_SITE_OTHER): Payer: Medicare Other | Admitting: Internal Medicine

## 2012-04-28 ENCOUNTER — Encounter: Payer: Self-pay | Admitting: Internal Medicine

## 2012-04-28 VITALS — BP 110/80 | Temp 98.0°F | Wt 209.0 lb

## 2012-04-28 DIAGNOSIS — R279 Unspecified lack of coordination: Secondary | ICD-10-CM

## 2012-04-28 DIAGNOSIS — R600 Localized edema: Secondary | ICD-10-CM

## 2012-04-28 DIAGNOSIS — E119 Type 2 diabetes mellitus without complications: Secondary | ICD-10-CM

## 2012-04-28 DIAGNOSIS — I2510 Atherosclerotic heart disease of native coronary artery without angina pectoris: Secondary | ICD-10-CM

## 2012-04-28 DIAGNOSIS — M7989 Other specified soft tissue disorders: Secondary | ICD-10-CM

## 2012-04-28 DIAGNOSIS — Z9181 History of falling: Secondary | ICD-10-CM

## 2012-04-28 DIAGNOSIS — R609 Edema, unspecified: Secondary | ICD-10-CM

## 2012-04-28 DIAGNOSIS — G609 Hereditary and idiopathic neuropathy, unspecified: Secondary | ICD-10-CM

## 2012-04-28 NOTE — Progress Notes (Signed)
  Subjective:    Patient ID: [name] [name], male    DOB: [birth year], 78 y.o.   MRN: [id number]  HPI  78 year old patient who is seen today complaining of pain and swelling involving his left leg. He fell approximately one week ago and then drove 4 hours by car back to town. He has had persistent swelling and minimal calf discomfort. He has a history of diabetes mellitus complicated by peripheral neuropathy. He also has a history of gait instability ataxia with frequent falls. His diabetes remained controlled on 7030 insulin. He has treated hypertension which has been stable. He has coronary artery disease   Review of Systems  Constitutional: Negative for fever, chills, appetite change and fatigue.  HENT: Negative for hearing loss, ear pain, congestion, sore throat, trouble swallowing, neck stiffness, dental problem, voice change and tinnitus.   Eyes: Negative for pain, discharge and visual disturbance.  Respiratory: Negative for cough, chest tightness, wheezing and stridor.   Cardiovascular: Positive for leg swelling. Negative for chest pain and palpitations.  Gastrointestinal: Negative for nausea, vomiting, abdominal pain, diarrhea, constipation, blood in stool and abdominal distention.  Genitourinary: Negative for urgency, hematuria, flank pain, discharge, difficulty urinating and genital sores.  Musculoskeletal: Positive for gait problem. Negative for myalgias, back pain, joint swelling and arthralgias.  Skin: Negative for rash.  Neurological: Negative for dizziness, syncope, speech difficulty, weakness, numbness and headaches.  Hematological: Negative for adenopathy. Does not bruise/bleed easily.  Psychiatric/Behavioral: Negative for behavioral problems and dysphoric mood. The patient is not nervous/anxious.        Objective:   Physical Exam  Constitutional: He appears well-developed and well-nourished. No distress.       bp 110/80   Musculoskeletal: He exhibits edema.       L  leg distal to knee is swollen          Assessment & Plan:  Left leg edema. Rule out left leg DVT. Likely diagnosis Diabetes mellitus with peripheral neuropathy History of ataxia and gait instability Coronary artery disease stable  We'll check venous Doppler. Continue furosemide and low-salt diet

## 2012-04-28 NOTE — Patient Instructions (Signed)
  Venous doppler evaluation as discussed  Limit your sodium (Salt) intake  Keep leg elevated as much as possible

## 2012-05-14 ENCOUNTER — Ambulatory Visit (INDEPENDENT_AMBULATORY_CARE_PROVIDER_SITE_OTHER): Payer: Medicare Other | Admitting: Family Medicine

## 2012-05-14 ENCOUNTER — Encounter: Payer: Self-pay | Admitting: Family Medicine

## 2012-05-14 VITALS — BP 142/72 | Temp 97.8°F | Wt 208.0 lb

## 2012-05-14 DIAGNOSIS — Z87898 Personal history of other specified conditions: Secondary | ICD-10-CM

## 2012-05-14 DIAGNOSIS — R609 Edema, unspecified: Secondary | ICD-10-CM

## 2012-05-14 DIAGNOSIS — E119 Type 2 diabetes mellitus without complications: Secondary | ICD-10-CM

## 2012-05-14 DIAGNOSIS — R600 Localized edema: Secondary | ICD-10-CM

## 2012-05-14 MED ORDER — TAMSULOSIN HCL 0.4 MG PO CAPS: 0.4000 mg | ORAL_CAPSULE | Freq: Every day | ORAL | Status: DC

## 2012-05-14 NOTE — Progress Notes (Signed)
  Subjective:    Patient ID: [name] [name], male    DOB: [birth year], 78 y.o.   MRN: [id number]  HPI  Patient seen recently with left lower extremity edema. Venous Doppler negative for clot. Symptoms unchanged. Remains on furosemide.  He had some problems with nocturia. Usually 4-5 times a night. Avoids drinking after about 6 PM. We considered urgency and he has previously been on VESIcare without improvement and we tried Myrbetriq and he denies any improvement. Generally has strong stream but occasionally does have slow stream. Question of incomplete emptying. He has history of BPH and has been on finasteride for several years. We have not checked PSAs recently because of his age and other comorbidities.  Type 2 diabetes. Last A1c 7.4%. Gaining some weight. No daytime urine frequency  Past Medical History  Diagnosis Date  . AODM 02/24/2009  . NEPHROPATHY, DIABETIC 07/10/2007  . DIABETIC  RETINOPATHY 07/10/2007  . HYPERLIPIDEMIA 07/10/2007  . PERIPHERAL NEUROPATHY 07/10/2007  . ESSENTIAL HYPERTENSION 10/07/2007  . CORONARY ARTERY DISEASE 07/10/2007  . GERD 02/17/2008  . ATAXIA 02/17/2008  . CHEST PAIN 06/28/2008  . BENIGN PROSTATIC HYPERTROPHY, HX OF 02/13/2009  . RISK OF FALLING 01/09/2010   Past Surgical History  Procedure Date  . Esophagogastroduodenoscopy 2008  . Cad 1993    graft Lima Lad  . Irrigation and debridement sebaceous cyst   . Eye surgery     catarac  . Appendectomy   . Hernia repair     reports that he has quit smoking. His smoking use included Pipe. He does not have any smokeless tobacco history on file. His alcohol and drug histories not on file. family history is not on file. Allergies  Allergen Reactions  . Pioglitazone     REACTION: Edema      Review of Systems  Constitutional: Negative for fever, chills and unexpected weight change.  Respiratory: Negative for shortness of breath.   Cardiovascular: Positive for leg swelling. Negative for chest pain and  palpitations.  Genitourinary: Positive for urgency, frequency and decreased urine volume. Negative for hematuria.       Objective:   Physical Exam  Constitutional: He appears well-developed and well-nourished.  Cardiovascular: Normal rate and regular rhythm.   Pulmonary/Chest: Effort normal and breath sounds normal. No respiratory distress. He has no wheezes. He has no rales.  Genitourinary:       Prostate is mildly enlarged. Symmetric. No rectal masses. Fecal soiling around anus No impaction.  Musculoskeletal:       Trace edema left lower extremity No calf tenderness.          Assessment & Plan:  #1 nocturia. Possibly combination of urgency and BPH. Continue finasteride. Start Flomax 0.4 mg once nightly. Discontinue Myrbetriq.  Reassess at followup. Continue to avoid caffeine. #2 type 2 diabetes. Work on weight loss. Recheck A1c at followup #3 leg edema.  No recent DVT by doppler.  Frequent elevation, continue lasix.  Pt unable to put on compression garments. Try to walk more as suspect some of this is venous stasis.

## 2012-05-14 NOTE — Patient Instructions (Addendum)
Benign Prostatic Hypertrophy   The prostate gland is part of the reproductive system of men. A normal prostate is about the size and shape of a walnut. The prostate gland makes a fluid that is mixed with sperm to make semen. This gland surrounds the urethra and is located in front of the rectum and just below the bladder. The bladder is where urine is stored. The urethra is the tube through which urine passes from the bladder to get out of the body. The prostate grows as a man ages. An enlarged prostate not caused by cancer is called benign prostatic hypertrophy (BPH). This is a common health problem in men over age 50. This condition is a normal part of aging. An enlarged prostate presses on the urethra. This makes it harder to pass urine. In the early stages of enlargement, the bladder can get by with a narrowed urethra by forcing the urine through. If the problem gets worse, medical or surgical treatment may be required.   This condition should be followed by your caregiver. Longstanding back pressure on the kidneys can cause infection. Back pressure and infection can progress to bladder damage and kidney (renal) failure. If needed, your caregiver may refer you to a specialist in kidney and prostate disease (urologist). CAUSES   The exact cause is not known.   SYMPTOMS    You are not able to completely empty your bladder.   Getting up often during the night to urinate.   Need to urinate frequently during the day.   Difficultly in starting urine flow.   Decrease in size and strength of the urine stream.   Dribbling after urination.   Pain on urination (more common with infection).   Inability to pass your water. This needs immediate treatment.  DIAGNOSIS   These tests will help your caregiver understand your problem:  Digital rectal exam (DRE). In a rectal exam, your caregiver checks your prostate by putting a gloved, lubricated finger into the rectum to feel the back of your prostate  gland. This exam detects the size of the gland and abnormal lumps or growths.   Urinalysis (exam of the urine). This may include a culture if there is concern about infection.   Prostate Specific Antigen (PSA). This is a blood test used to screen for prostate cancer. It is not used alone for diagnosing prostate cancer.   Rectal ultrasound (sonogram). This test uses sound waves to electronically produce a "picture" of the prostate. It helps examine the prostate gland for cancer.  TREATMENT   Mild symptoms may not need treatment. Simple observation and yearly exams may be all that is required. Medications and surgery are options for more severe problems. Your caregiver can help you make an informed decision for what is best. Two classes of medications are available for relief of prostate symptoms:  Medications that shrink the prostate. This helps relieve symptoms.   Uncommon side effects include problems with sexual function.   Medications to relax the muscle of the prostate. This also relieves the obstruction.   Side effects can include dizziness, fatigue, lightheadedness, and retrograde ejaculation (diminished volume of ejaculate).  Several types of surgical treatments are available for relief of prostate symptoms:  Transurethral resection of the prostate (TURP). In this treatment, an instrument is inserted through opening at the tip of the penis. It is used to cut away pieces of the inner core of the prostate. The pieces are removed through the same opening of the penis. This removes the   obstruction and helps get rid of the symptoms.   Transurethral incision (TUIP). In this procedure, small cuts are made in the prostate. This lessens the prostates pressure on the urethra.   Transurethral microwave thermotherapy (TUMT). This procedure uses microwaves to create heat. The heat destroys and removes a small amount of prostate tissue.   Transurethral needle ablation (TUNA). This is a procedure  that uses radio frequencies to do the same as TUMT.   Interstitial laser coagulation (ILC). This is a procedure that uses a laser to do the same as TUMT and TUNA.   Transurethral electrovaporization (TUVP). This is a procedure that uses electrodes to do the same as the procedures listed above.  Regardless of the method of treatment chosen, you and your caregiver will discuss the options. With this knowledge, you along with your caregiver can decide upon the best treatment for you. SEEK MEDICAL CARE IF:    You develop chills, fever of 100.5 F (38.1 C), or night sweats.   There is unexplained back pain.   Symptoms are not helped by medications prescribed.   You develop medication side effects.   Your urine becomes very dark or has a bad smell.  SEEK IMMEDIATE MEDICAL CARE IF:    You are suddenly unable to urinate. This is an emergency. You should be seen immediately.   There are large amounts of blood or clots in the urine.   Your urinary problems become unmanageable.   You develop lightheadedness, severe dizziness, or you feel faint.   You develop moderate to severe low back or flank pain.   You develop chills or fever.  Document Released: 10/07/2005 Document Revised: 09/26/2011 Document Reviewed: 06/29/2007 Exit[employer] Patient Information 2012 [employer]. 

## 2012-06-25 ENCOUNTER — Telehealth: Payer: Self-pay | Admitting: Speech Pathology

## 2012-06-25 DIAGNOSIS — R32 Unspecified urinary incontinence: Secondary | ICD-10-CM

## 2012-06-25 NOTE — Telephone Encounter (Signed)
Pt was able to make his own appt with [facility] and they told him to call to go through the proper channels at his PCP so they can obtain medical records and referral  I will order if you agree with referral

## 2012-06-25 NOTE — Telephone Encounter (Signed)
OK to refer.

## 2012-06-25 NOTE — Telephone Encounter (Signed)
Pt called and is asking for a new referral to Urology for frequency and leakage.  Pt already has appt scheduled at [facility] with Dr. Verlin Dike on 9/13 @ 3.45pm. Pt needs [place] to fax referral and records.     If Dr. Caryl Never agrees, please send me a new referral for Amb Referral to Urology.

## 2012-07-16 ENCOUNTER — Ambulatory Visit: Payer: Medicare Other | Admitting: Family Medicine

## 2012-09-24 ENCOUNTER — Other Ambulatory Visit: Payer: Self-pay | Admitting: Family Medicine

## 2012-09-24 MED ORDER — INSULIN NPH ISOPHANE & REGULAR (70-30) 100 UNIT/ML ~~LOC~~ SUSP: SUBCUTANEOUS | Status: DC

## 2012-09-24 NOTE — Telephone Encounter (Signed)
OK to change to Humalog-he will take same number orf units per day as Novolog.

## 2012-09-24 NOTE — Telephone Encounter (Signed)
I do not see Humalog 70/30, I only see 75/25.  I did find Humulin 70/30, so that is what I ordered

## 2012-09-24 NOTE — Telephone Encounter (Signed)
Please advise 

## 2012-09-24 NOTE — Telephone Encounter (Signed)
Patient hs [employer]. He called and will need to switch from insulin aspart protamine-insulin aspart (NOVOLOG 70/30) (70-30) 100 UNIT/ML injection To Humalin/Humalog, as [employer] will no longer cover Novolog. He is requesting we send in that new insuline script to his mail order, [facility].

## 2012-09-25 MED ORDER — INSULIN NPH ISOPHANE & REGULAR (70-30) 100 UNIT/ML ~~LOC~~ SUSP: SUBCUTANEOUS | Status: DC

## 2012-09-25 NOTE — Addendum Note (Signed)
Addended by: Melchor Amour on: 09/25/2012 09:20 AM   Modules accepted: Orders

## 2012-09-25 NOTE — Telephone Encounter (Signed)
New Rx faxed, confirmation received

## 2012-09-25 NOTE — Telephone Encounter (Signed)
Switch to Humulin 70/30 with same units as before.

## 2012-09-25 NOTE — Addendum Note (Signed)
Addended by: Melchor Amour on: 09/25/2012 10:30 AM   Modules accepted: Orders

## 2012-09-30 ENCOUNTER — Other Ambulatory Visit: Payer: Self-pay | Admitting: *Deleted

## 2012-09-30 MED ORDER — SIMVASTATIN 80 MG PO TABS: 80.0000 mg | ORAL_TABLET | Freq: Every day | ORAL | Status: DC

## 2012-10-27 ENCOUNTER — Other Ambulatory Visit: Payer: Self-pay | Admitting: *Deleted

## 2012-10-27 MED ORDER — FUROSEMIDE 20 MG PO TABS: 20.0000 mg | ORAL_TABLET | Freq: Every day | ORAL | Status: DC

## 2012-10-27 MED ORDER — LISINOPRIL 20 MG PO TABS: 20.0000 mg | ORAL_TABLET | Freq: Every day | ORAL | Status: DC

## 2012-10-27 MED ORDER — FINASTERIDE 5 MG PO TABS: 5.0000 mg | ORAL_TABLET | Freq: Every day | ORAL | Status: DC

## 2012-11-20 ENCOUNTER — Other Ambulatory Visit: Payer: Self-pay | Admitting: *Deleted

## 2012-11-20 MED ORDER — METFORMIN HCL 1000 MG PO TABS: 1000.0000 mg | ORAL_TABLET | Freq: Two times a day (BID) | ORAL | Status: DC

## 2012-12-09 ENCOUNTER — Telehealth: Payer: Self-pay | Admitting: Family Medicine

## 2012-12-09 MED ORDER — SIMVASTATIN 80 MG PO TABS: 80.0000 mg | ORAL_TABLET | Freq: Every day | ORAL | Status: DC

## 2012-12-09 NOTE — Telephone Encounter (Signed)
Pt needs new rx simvastatin 80 mg #90 with 3 refills fax to [place] [phone]

## 2013-05-03 ENCOUNTER — Ambulatory Visit (INDEPENDENT_AMBULATORY_CARE_PROVIDER_SITE_OTHER): Payer: Medicare Other | Admitting: Family Medicine

## 2013-05-03 ENCOUNTER — Encounter: Payer: Self-pay | Admitting: Family Medicine

## 2013-05-03 VITALS — BP 120/64 | HR 75 | Temp 97.9°F | Wt 204.0 lb

## 2013-05-03 DIAGNOSIS — E119 Type 2 diabetes mellitus without complications: Secondary | ICD-10-CM

## 2013-05-03 DIAGNOSIS — I10 Essential (primary) hypertension: Secondary | ICD-10-CM

## 2013-05-03 DIAGNOSIS — R3915 Urgency of urination: Secondary | ICD-10-CM

## 2013-05-03 DIAGNOSIS — E785 Hyperlipidemia, unspecified: Secondary | ICD-10-CM

## 2013-05-03 LAB — BASIC METABOLIC PANEL
Calcium: 9.9 mg/dL (ref 8.4–10.5)
Creatinine, Ser: 1 mg/dL (ref 0.4–1.5)
GFR: 75.36 mL/min (ref >60.00)
Sodium: 140 mEq/L (ref 135–145)

## 2013-05-03 LAB — LIPID PANEL
HDL: 41.6 mg/dL (ref >39.00)
Triglycerides: 125 mg/dL (ref 0.0–149.0)

## 2013-05-03 LAB — HEMOGLOBIN A1C: Hgb A1c MFr Bld: 6.8 % — ABNORMAL HIGH (ref 4.6–6.5)

## 2013-05-03 LAB — HEPATIC FUNCTION PANEL
ALT: 10 U/L (ref 0–53)
AST: 17 U/L (ref 0–37)
Albumin: 4.3 g/dL (ref 3.5–5.2)
Alkaline Phosphatase: 51 U/L (ref 39–117)
Total Bilirubin: 0.4 mg/dL (ref 0.3–1.2)

## 2013-05-03 NOTE — Patient Instructions (Signed)

## 2013-05-03 NOTE — Progress Notes (Signed)
  Subjective:    Patient ID: [name] [name], male    DOB: 1925/04/02, 79 y.o.   MRN: [id number]  HPI Patient seen for routine medical followup He has especially some progressive problems over the past year with urinary urgency. He seen multiple urologists. Trial of multiple medications and maximal medical therapy but still had some nocturia.  Diabetes been well controlled. Fasting blood sugars mostly low 100s. No recent hypoglycemia. Insulin regimen is reviewed. He also remains on metformin. He is set up for eye exam in one month. He has chronic hearing loss which is unchanged. He has some diabetic neuropathy symptoms and has had some occasional falls or the past year. Ambulates with cane  He has hyperlipidemia treated with simvastatin. Has been for several years on higher doses simvastatin but no myalgias.  He has not been seen here recently but is followed through the [place] [facility] and apparently had some labs today or during the past year  Past Medical History  Diagnosis Date  . AODM 02/24/2009  . NEPHROPATHY, DIABETIC 07/10/2007  . DIABETIC  RETINOPATHY 07/10/2007  . HYPERLIPIDEMIA 07/10/2007  . PERIPHERAL NEUROPATHY 07/10/2007  . ESSENTIAL HYPERTENSION 10/07/2007  . CORONARY ARTERY DISEASE 07/10/2007  . GERD 02/17/2008  . ATAXIA 02/17/2008  . CHEST PAIN 06/28/2008  . BENIGN PROSTATIC HYPERTROPHY, HX OF 02/13/2009  . RISK OF FALLING 01/09/2010   Past Surgical History  Procedure Laterality Date  . Esophagogastroduodenoscopy  2008  . Cad  1993    graft Lima Lad  . Irrigation and debridement sebaceous cyst    . Eye surgery      catarac  . Appendectomy    . Hernia repair      reports that he has quit smoking. His smoking use included Pipe. He does not have any smokeless tobacco history on file. His alcohol and drug histories are not on file. family history is not on file. Allergies  Allergen Reactions  . Pioglitazone     REACTION: Edema      Review of Systems   Constitutional: Negative for fever and chills.  HENT: Negative for trouble swallowing.   Respiratory: Negative for cough and shortness of breath.   Cardiovascular: Negative for chest pain, palpitations and leg swelling.  Gastrointestinal: Negative for nausea and vomiting.  Neurological: Negative for dizziness and syncope.  Psychiatric/Behavioral: Negative for confusion.       Objective:   Physical Exam  HENT:  Right Ear: External ear normal.  Left Ear: External ear normal.  Neck: Neck supple. No thyromegaly present.  Cardiovascular: Normal rate.   Pulmonary/Chest: Effort normal and breath sounds normal. No respiratory distress. He has no wheezes. He has no rales.  Musculoskeletal: He exhibits no edema.  Skin:  No foot lesions. Impair monofilament testing bilaterally.          Assessment & Plan:  #1 type 2 diabetes. History of good control. Recheck A1c. Followup for eye exam  #2 dyslipidemia. Recheck lipid panel. Consider reducing simvastatin dosage, especially his age if lipids are well-controlled #3 hypertension stable and well controlled #4 urinary urgency-pt has not responded to several medications.

## 2013-11-05 ENCOUNTER — Other Ambulatory Visit: Payer: Self-pay

## 2013-11-05 ENCOUNTER — Ambulatory Visit (INDEPENDENT_AMBULATORY_CARE_PROVIDER_SITE_OTHER): Payer: Medicare Other | Admitting: Family Medicine

## 2013-11-05 ENCOUNTER — Encounter: Payer: Self-pay | Admitting: Family Medicine

## 2013-11-05 VITALS — BP 134/70 | HR 78 | Temp 97.5°F | Wt 200.0 lb

## 2013-11-05 DIAGNOSIS — E119 Type 2 diabetes mellitus without complications: Secondary | ICD-10-CM

## 2013-11-05 DIAGNOSIS — Z23 Encounter for immunization: Secondary | ICD-10-CM

## 2013-11-05 DIAGNOSIS — I10 Essential (primary) hypertension: Secondary | ICD-10-CM

## 2013-11-05 DIAGNOSIS — E669 Obesity, unspecified: Secondary | ICD-10-CM | POA: Insufficient documentation

## 2013-11-05 DIAGNOSIS — Z9181 History of falling: Secondary | ICD-10-CM

## 2013-11-05 LAB — BASIC METABOLIC PANEL
BUN: 27 mg/dL — AB (ref 6–23)
CALCIUM: 9.4 mg/dL (ref 8.4–10.5)
CO2: 28 meq/L (ref 19–32)
CREATININE: 1.2 mg/dL (ref 0.4–1.5)
Chloride: 101 mEq/L (ref 96–112)
GFR: 64.06 mL/min (ref >60.00)
Glucose, Bld: 301 mg/dL — ABNORMAL HIGH (ref 70–99)
Potassium: 5 mEq/L (ref 3.5–5.1)
Sodium: 136 mEq/L (ref 135–145)

## 2013-11-05 LAB — HM DIABETES FOOT EXAM

## 2013-11-05 LAB — HEMOGLOBIN A1C: Hgb A1c MFr Bld: 7.2 % — ABNORMAL HIGH (ref 4.6–6.5)

## 2013-11-05 MED ORDER — TAMSULOSIN HCL 0.4 MG PO CAPS: 0.4000 mg | ORAL_CAPSULE | Freq: Every day | ORAL | Status: AC

## 2013-11-05 MED ORDER — METFORMIN HCL 1000 MG PO TABS: 1000.0000 mg | ORAL_TABLET | Freq: Two times a day (BID) | ORAL | Status: AC

## 2013-11-05 MED ORDER — FUROSEMIDE 20 MG PO TABS: 20.0000 mg | ORAL_TABLET | Freq: Every day | ORAL | Status: AC

## 2013-11-05 MED ORDER — SIMVASTATIN 80 MG PO TABS: 80.0000 mg | ORAL_TABLET | Freq: Every day | ORAL | Status: DC

## 2013-11-05 MED ORDER — LISINOPRIL 20 MG PO TABS: 20.0000 mg | ORAL_TABLET | Freq: Every day | ORAL | Status: AC

## 2013-11-05 MED ORDER — FINASTERIDE 5 MG PO TABS: 5.0000 mg | ORAL_TABLET | Freq: Every day | ORAL | Status: AC

## 2013-11-05 NOTE — Progress Notes (Signed)
   Subjective:    Patient ID: [name] [name], male    DOB: [birth year], 80 y.o.   MRN: [id number]  HPI Medical Follow up Patient continues to reside in assisted living complex. His wife continues to decline in health. He has done relatively well medically with no recent acute issues. He has type 2 diabetes, history of CAD, hypertension, dyslipidemia, GERD, high risk for falls. Had a couple falls in the past year but no associated injury. He has diabetic neuropathy which contributes to his balance issues. Had extensive physical therapy in the past.  Recent blood sugars fasting run 130. Last A1c 6.8%. No recent hypoglycemia. He takes combination of metformin plus insulin. Lipids have been well controlled. Blood pressures been stable. No orthostasis. He has not had flu vaccine yet and requests today.  Past Medical History  Diagnosis Date  . AODM 02/24/2009  . NEPHROPATHY, DIABETIC 07/10/2007  . DIABETIC  RETINOPATHY 07/10/2007  . HYPERLIPIDEMIA 07/10/2007  . PERIPHERAL NEUROPATHY 07/10/2007  . ESSENTIAL HYPERTENSION 10/07/2007  . CORONARY ARTERY DISEASE 07/10/2007  . GERD 02/17/2008  . ATAXIA 02/17/2008  . CHEST PAIN 06/28/2008  . BENIGN PROSTATIC HYPERTROPHY, HX OF 02/13/2009  . RISK OF FALLING 01/09/2010   Past Surgical History  Procedure Laterality Date  . Esophagogastroduodenoscopy  2008  . Cad  1993    graft Lima Lad  . Irrigation and debridement sebaceous cyst    . Eye surgery      catarac  . Appendectomy    . Hernia repair      reports that he has quit smoking. His smoking use included Pipe. He does not have any smokeless tobacco history on file. His alcohol and drug histories are not on file. family history is not on file. Allergies  Allergen Reactions  . Pioglitazone     REACTION: Edema      Review of Systems  Eyes: Negative for visual disturbance.  Respiratory: Negative for cough, chest tightness and shortness of breath.   Cardiovascular: Negative for chest pain,  palpitations and leg swelling.  Gastrointestinal: Negative for abdominal pain.  Endocrine: Positive for polydipsia. Negative for polyuria.  Genitourinary: Negative for dysuria.  Neurological: Negative for dizziness, syncope, weakness, light-headedness and headaches.       Objective:   Physical Exam  Constitutional: He is oriented to person, place, and time. He appears well-developed and well-nourished.  HENT:  Right Ear: External ear normal.  Left Ear: External ear normal.  Mouth/Throat: Oropharynx is clear and moist.  Neck: Neck supple. No thyromegaly present.  Cardiovascular: Normal rate and regular rhythm.   Pulmonary/Chest: Effort normal and breath sounds normal. No respiratory distress. He has no wheezes. He has no rales.  Musculoskeletal: He exhibits no edema.  Neurological: He is alert and oriented to person, place, and time.          Assessment & Plan:  #1 type 2 diabetes. History of excellent control. Recheck A1c. #2 hypertension. Well controlled. Continue lisinopril #3 hyperlipidemia. We'll plan repeat lipids at followup in 6 months #4 health maintenance. Needs Prevnar 13. We are currently out and he will get this on followup. Flu vaccine given.

## 2013-11-05 NOTE — Progress Notes (Signed)
Pre visit review using our clinic review tool, if applicable. No additional management support is needed unless otherwise documented below in the visit note. 

## 2013-11-08 ENCOUNTER — Telehealth: Payer: Self-pay | Admitting: Family Medicine

## 2013-11-08 NOTE — Telephone Encounter (Signed)
Relevant patient education mailed to patient.  

## 2013-12-17 ENCOUNTER — Encounter: Payer: Self-pay | Admitting: Family Medicine

## 2013-12-17 ENCOUNTER — Ambulatory Visit (INDEPENDENT_AMBULATORY_CARE_PROVIDER_SITE_OTHER): Payer: Medicare Other | Admitting: Family Medicine

## 2013-12-17 VITALS — BP 132/68 | HR 79 | Temp 98.4°F | Wt 192.0 lb

## 2013-12-17 DIAGNOSIS — Z87898 Personal history of other specified conditions: Secondary | ICD-10-CM

## 2013-12-17 DIAGNOSIS — I10 Essential (primary) hypertension: Secondary | ICD-10-CM

## 2013-12-17 DIAGNOSIS — E119 Type 2 diabetes mellitus without complications: Secondary | ICD-10-CM

## 2013-12-17 DIAGNOSIS — G609 Hereditary and idiopathic neuropathy, unspecified: Secondary | ICD-10-CM

## 2013-12-17 DIAGNOSIS — H6123 Impacted cerumen, bilateral: Secondary | ICD-10-CM

## 2013-12-17 DIAGNOSIS — E785 Hyperlipidemia, unspecified: Secondary | ICD-10-CM

## 2013-12-17 DIAGNOSIS — I2510 Atherosclerotic heart disease of native coronary artery without angina pectoris: Secondary | ICD-10-CM

## 2013-12-17 DIAGNOSIS — H6120 Impacted cerumen, unspecified ear: Secondary | ICD-10-CM

## 2013-12-17 NOTE — Progress Notes (Signed)
Pre visit review using our clinic review tool, if applicable. No additional management support is needed unless otherwise documented below in the visit note. 

## 2013-12-17 NOTE — Progress Notes (Signed)
Subjective:    Patient ID: [name] [name], male    DOB: 08/09/1925, 80 y.o.   MRN: [id number]  HPI Seen for medical followup he has chronic problems include history of obesity, type 2 diabetes, CAD, hyperlipidemia, GERD, BPH. He and his wife are looking at changing from current rest home to a different rest home. He is requesting completion of FL2 forms. He also needs PPD placed. No history of positive PPD.  Patient has type 2 diabetes currently treated with Humulin 70/30 insulin 20 units in the morning 24 units at night. No recent hypoglycemia. Fasting blood sugars consistently ranging between 90 and 130. Last A1c 7.2%. He does have some peripheral neuropathy which is stable. No recent falls. He ambulates with a cane  Hyperlipidemia treated with simvastatin. He has been for several years on high-dose simvastatin and has never had any side effects. No myalgias. BPH stable on Proscar and Flomax. No recent obstructive urinary symptoms.  He has bilateral hearing aids and still has poor hearing. Cerumen impactions in the past  Past Medical History  Diagnosis Date  . AODM 02/24/2009  . NEPHROPATHY, DIABETIC 07/10/2007  . DIABETIC  RETINOPATHY 07/10/2007  . HYPERLIPIDEMIA 07/10/2007  . PERIPHERAL NEUROPATHY 07/10/2007  . ESSENTIAL HYPERTENSION 10/07/2007  . CORONARY ARTERY DISEASE 07/10/2007  . GERD 02/17/2008  . ATAXIA 02/17/2008  . CHEST PAIN 06/28/2008  . BENIGN PROSTATIC HYPERTROPHY, HX OF 02/13/2009  . RISK OF FALLING 01/09/2010   Past Surgical History  Procedure Laterality Date  . Esophagogastroduodenoscopy  2008  . Cad  1993    graft Lima Lad  . Irrigation and debridement sebaceous cyst    . Eye surgery      catarac  . Appendectomy    . Hernia repair      reports that he has quit smoking. His smoking use included Pipe. He does not have any smokeless tobacco history on file. His alcohol and drug histories are not on file. family history is not on file. Allergies  Allergen Reactions    . Pioglitazone     REACTION: Edema      Review of Systems  Constitutional: Negative for appetite change, fatigue and unexpected weight change.  HENT: Positive for hearing loss.   Eyes: Positive for visual disturbance (Chronic and unchanged).  Respiratory: Negative for cough, chest tightness and shortness of breath.   Cardiovascular: Negative for chest pain, palpitations and leg swelling.  Gastrointestinal: Negative for nausea, vomiting and abdominal pain.  Endocrine: Negative for polydipsia and polyuria.  Genitourinary: Negative for dysuria.  Neurological: Negative for dizziness, syncope, weakness, light-headedness and headaches.  Hematological: Negative for adenopathy.       Objective:   Physical Exam  Constitutional: He is oriented to person, place, and time. He appears well-developed and well-nourished.  HENT:  Cerumen impaction bilaterally. Using curette we were able to remove impactions without difficulty.  Neck: Neck supple. No thyromegaly present.  Cardiovascular: Normal rate.   Pulmonary/Chest: Effort normal and breath sounds normal. No respiratory distress. He has no wheezes. He has no rales.  Musculoskeletal: He exhibits no edema.  Neurological: He is alert and oriented to person, place, and time.          Assessment & Plan:  #1 bilateral cerumen impactions. Removed with curette. Patient noted some hearing improvement with hearing aids afterwards #2 type 2 diabetes. History of stable control. Recent A1c 7.2% #3 hyperlipidemia. Continue simvastatin.  #4 BPH symptomatically stable #5 FL 2 forms were completed.  Patient needs  PPD placed and we are out currently. He'll return later for this

## 2013-12-20 ENCOUNTER — Telehealth: Payer: Self-pay | Admitting: Family Medicine

## 2013-12-20 NOTE — Telephone Encounter (Signed)
Relevant patient education mailed to patient.  

## 2014-03-22 ENCOUNTER — Encounter: Payer: Self-pay | Admitting: Family Medicine

## 2014-03-22 ENCOUNTER — Ambulatory Visit (INDEPENDENT_AMBULATORY_CARE_PROVIDER_SITE_OTHER): Payer: Medicare Other | Admitting: Family Medicine

## 2014-03-22 VITALS — BP 122/68 | HR 74 | Wt 194.0 lb

## 2014-03-22 DIAGNOSIS — R350 Frequency of micturition: Secondary | ICD-10-CM

## 2014-03-22 LAB — POCT URINALYSIS DIPSTICK
Bilirubin, UA: NEGATIVE
Blood, UA: NEGATIVE
Glucose, UA: NEGATIVE
Ketones, UA: NEGATIVE
Leukocytes, UA: NEGATIVE
Nitrite, UA: NEGATIVE
PROTEIN UA: NEGATIVE
Spec Grav, UA: 1.01
Urobilinogen, UA: 0.2
pH, UA: 5

## 2014-03-22 NOTE — Patient Instructions (Signed)
NO caffeine (coffee or tea) after 2-3 pm Avoid excessive fluid at night

## 2014-03-22 NOTE — Progress Notes (Signed)
   Subjective:    Patient ID: [name] [name], male    DOB: 03-09-1925, 80 y.o.   MRN: [id number]  Urinary Frequency  Associated symptoms include frequency. Pertinent negatives include no chills.   Here with c/o urine frequency -especially at night. Long history of BPH. He has seen urologist previously. He's been on multiple agents including alpha blockers, anti-cholinergics, and Myrbetriq but none of these helped much. He remains on Flomax and finasteride for BPH. He does still consume considerable caffeine. In fact, is drinking tea and coffee spill most nights around 6 PM. He has type 2 diabetes which has been fairly well controlled. Recent A1c 7.3%  At [facility]. Fasting blood sugar was 179. No burning with urination.  Past Medical History  Diagnosis Date  . AODM 02/24/2009  . NEPHROPATHY, DIABETIC 07/10/2007  . DIABETIC  RETINOPATHY 07/10/2007  . HYPERLIPIDEMIA 07/10/2007  . PERIPHERAL NEUROPATHY 07/10/2007  . ESSENTIAL HYPERTENSION 10/07/2007  . CORONARY ARTERY DISEASE 07/10/2007  . GERD 02/17/2008  . ATAXIA 02/17/2008  . CHEST PAIN 06/28/2008  . BENIGN PROSTATIC HYPERTROPHY, HX OF 02/13/2009  . RISK OF FALLING 01/09/2010   Past Surgical History  Procedure Laterality Date  . Esophagogastroduodenoscopy  2008  . Cad  1993    graft Lima Lad  . Irrigation and debridement sebaceous cyst    . Eye surgery      catarac  . Appendectomy    . Hernia repair      reports that he has quit smoking. His smoking use included Pipe. He does not have any smokeless tobacco history on file. His alcohol and drug histories are not on file. family history is not on file. Allergies  Allergen Reactions  . Pioglitazone     REACTION: Edema      Review of Systems  Constitutional: Negative for fever and chills.  Genitourinary: Positive for frequency. Negative for dysuria.       Objective:   Physical Exam  Constitutional: He appears well-developed and well-nourished.  Cardiovascular: Normal rate and regular  rhythm.   Pulmonary/Chest: Effort normal and breath sounds normal. No respiratory distress. He has no wheezes. He has no rales.          Assessment & Plan:  Urinary frequency and urgency. He has history of BPH and obstructive symptoms which are currently stable on medications above. He has not responded to multiple medications for urgency in the past. Would recommend rather than trying additional medication try reduce caffeine intake- especially after 2 to 3 PM. Urinalysis today is normal.

## 2014-03-22 NOTE — Progress Notes (Signed)
Pre visit review using our clinic review tool, if applicable. No additional management support is needed unless otherwise documented below in the visit note. 

## 2014-03-30 LAB — HM DIABETES EYE EXAM

## 2014-05-06 ENCOUNTER — Ambulatory Visit (INDEPENDENT_AMBULATORY_CARE_PROVIDER_SITE_OTHER): Payer: Medicare Other | Admitting: Family Medicine

## 2014-05-06 ENCOUNTER — Encounter: Payer: Self-pay | Admitting: Family Medicine

## 2014-05-06 VITALS — BP 134/74 | HR 84 | Wt 192.0 lb

## 2014-05-06 DIAGNOSIS — I10 Essential (primary) hypertension: Secondary | ICD-10-CM

## 2014-05-06 DIAGNOSIS — E1140 Type 2 diabetes mellitus with diabetic neuropathy, unspecified: Secondary | ICD-10-CM

## 2014-05-06 DIAGNOSIS — Z23 Encounter for immunization: Secondary | ICD-10-CM

## 2014-05-06 DIAGNOSIS — E1149 Type 2 diabetes mellitus with other diabetic neurological complication: Secondary | ICD-10-CM

## 2014-05-06 DIAGNOSIS — E1142 Type 2 diabetes mellitus with diabetic polyneuropathy: Secondary | ICD-10-CM

## 2014-05-06 DIAGNOSIS — E785 Hyperlipidemia, unspecified: Secondary | ICD-10-CM

## 2014-05-06 LAB — BASIC METABOLIC PANEL
BUN: 23 mg/dL (ref 6–23)
CHLORIDE: 102 meq/L (ref 96–112)
CO2: 29 meq/L (ref 19–32)
CREATININE: 1.2 mg/dL (ref 0.4–1.5)
Calcium: 9.6 mg/dL (ref 8.4–10.5)
GFR: 63.98 mL/min (ref >60.00)
Glucose, Bld: 70 mg/dL (ref 70–99)
Potassium: 4.2 mEq/L (ref 3.5–5.1)
Sodium: 140 mEq/L (ref 135–145)

## 2014-05-06 LAB — LIPID PANEL
CHOLESTEROL: 102 mg/dL (ref 0–200)
HDL: 36.4 mg/dL — ABNORMAL LOW (ref >39.00)
LDL CALC: 39 mg/dL (ref 0–99)
NonHDL: 65.6
TRIGLYCERIDES: 133 mg/dL (ref 0.0–149.0)
Total CHOL/HDL Ratio: 3
VLDL: 26.6 mg/dL (ref 0.0–40.0)

## 2014-05-06 LAB — HEPATIC FUNCTION PANEL
ALBUMIN: 3.7 g/dL (ref 3.5–5.2)
ALT: 12 U/L (ref 0–53)
AST: 14 U/L (ref 0–37)
Alkaline Phosphatase: 57 U/L (ref 39–117)
Bilirubin, Direct: 0.1 mg/dL (ref 0.0–0.3)
TOTAL PROTEIN: 7.2 g/dL (ref 6.0–8.3)
Total Bilirubin: 0.4 mg/dL (ref 0.2–1.2)

## 2014-05-06 LAB — HM DIABETES FOOT EXAM

## 2014-05-06 LAB — HEMOGLOBIN A1C: HEMOGLOBIN A1C: 6.5 % (ref 4.6–6.5)

## 2014-05-06 NOTE — Progress Notes (Signed)
Pre visit review using our clinic review tool, if applicable. No additional management support is needed unless otherwise documented below in the visit note. 

## 2014-05-06 NOTE — Progress Notes (Signed)
   Subjective:    Patient ID: [name] DallasRaleigh D Armbrust, male    DOB: February 10, 1925, 80 y.o.   MRN: [id number]  HPI Patient here for routine medical followup  His wife has continued to decline and has some dementia issues and they are looking at moving to a higher skilled level of care with the next week.  Type 2 diabetes. History of fair control. Last A1c 7.2%. Not monitoring blood sugars regularly. He has complication of neuropathy which has made gait difficult. ambulates with walker  Hyperlipidemia treated with simvastatin. No myalgias.  BPH. No obstructive symptoms. Remains on Flomax and finasteride. He does have some urgency which has been chronic.  No history of Prevnar 13.  Past Medical History  Diagnosis Date  . AODM 02/24/2009  . NEPHROPATHY, DIABETIC 07/10/2007  . DIABETIC  RETINOPATHY 07/10/2007  . HYPERLIPIDEMIA 07/10/2007  . PERIPHERAL NEUROPATHY 07/10/2007  . ESSENTIAL HYPERTENSION 10/07/2007  . CORONARY ARTERY DISEASE 07/10/2007  . GERD 02/17/2008  . ATAXIA 02/17/2008  . CHEST PAIN 06/28/2008  . BENIGN PROSTATIC HYPERTROPHY, HX OF 02/13/2009  . RISK OF FALLING 01/09/2010   Past Surgical History  Procedure Laterality Date  . Esophagogastroduodenoscopy  2008  . Cad  1993    graft Lima Lad  . Irrigation and debridement sebaceous cyst    . Eye surgery      catarac  . Appendectomy    . Hernia repair      reports that he has quit smoking. His smoking use included Pipe. He does not have any smokeless tobacco history on file. His alcohol and drug histories are not on file. family history is not on file. Allergies  Allergen Reactions  . Pioglitazone     REACTION: Edema      Review of Systems  Constitutional: Negative for fatigue and unexpected weight change.  Eyes: Negative for visual disturbance.  Respiratory: Negative for cough, chest tightness and shortness of breath.   Cardiovascular: Negative for chest pain, palpitations and leg swelling.  Endocrine: Negative for polydipsia  and polyuria.  Neurological: Negative for dizziness, syncope, weakness, light-headedness and headaches.       Objective:   Physical Exam  Constitutional: He is oriented to person, place, and time. He appears well-developed and well-nourished.  HENT:  Right Ear: External ear normal.  Left Ear: External ear normal.  Mouth/Throat: Oropharynx is clear and moist.  Eyes: Pupils are equal, round, and reactive to light.  Neck: Neck supple. No thyromegaly present.  Cardiovascular: Normal rate and regular rhythm.   Pulmonary/Chest: Effort normal and breath sounds normal. No respiratory distress. He has no wheezes. He has no rales.  Musculoskeletal: He exhibits no edema.  Neurological: He is alert and oriented to person, place, and time.          Assessment & Plan:  #1 type 2 diabetes. Recheck A1c. He is getting regular eye exams through the [facility] #2 hypertension which is stable and at goal #3 hyperlipidemia. Check lipid and hepatic panel #4 health maintenance. Prevnar 13 given

## 2014-05-20 ENCOUNTER — Telehealth: Payer: Self-pay | Admitting: Family Medicine

## 2014-05-20 NOTE — Telephone Encounter (Signed)
Per daughter pt is packed and ready to be moved in to nursing facility tday , however, the FL-2 that was faxed to our office has not been received.  Please call daughter back asap with status of completed FL-2, she lives nearby and can pick it up at any time today. ° °

## 2014-05-20 NOTE — Telephone Encounter (Signed)
FL2 Form filled out again due to other form expired and hand delivered to pt's daughter Lawson FiscalLori.

## 2015-12-06 DIAGNOSIS — I10 Essential (primary) hypertension: Secondary | ICD-10-CM | POA: Diagnosis not present

## 2016-01-06 DIAGNOSIS — E1140 Type 2 diabetes mellitus with diabetic neuropathy, unspecified: Secondary | ICD-10-CM | POA: Diagnosis not present

## 2016-01-20 DIAGNOSIS — N390 Urinary tract infection, site not specified: Secondary | ICD-10-CM | POA: Diagnosis not present

## 2016-01-27 DIAGNOSIS — E1140 Type 2 diabetes mellitus with diabetic neuropathy, unspecified: Secondary | ICD-10-CM | POA: Diagnosis not present

## 2016-01-27 DIAGNOSIS — I10 Essential (primary) hypertension: Secondary | ICD-10-CM | POA: Diagnosis not present

## 2016-01-27 DIAGNOSIS — I259 Chronic ischemic heart disease, unspecified: Secondary | ICD-10-CM | POA: Diagnosis not present

## 2016-01-30 DIAGNOSIS — E1140 Type 2 diabetes mellitus with diabetic neuropathy, unspecified: Secondary | ICD-10-CM | POA: Diagnosis not present

## 2016-01-30 DIAGNOSIS — E559 Vitamin D deficiency, unspecified: Secondary | ICD-10-CM | POA: Diagnosis not present

## 2016-01-30 DIAGNOSIS — G20 Parkinson's disease: Secondary | ICD-10-CM | POA: Diagnosis not present

## 2016-02-07 DIAGNOSIS — R319 Hematuria, unspecified: Secondary | ICD-10-CM | POA: Diagnosis not present

## 2016-02-07 DIAGNOSIS — Z79899 Other long term (current) drug therapy: Secondary | ICD-10-CM | POA: Diagnosis not present

## 2016-02-07 DIAGNOSIS — D519 Vitamin B12 deficiency anemia, unspecified: Secondary | ICD-10-CM | POA: Diagnosis not present

## 2016-02-07 DIAGNOSIS — E559 Vitamin D deficiency, unspecified: Secondary | ICD-10-CM | POA: Diagnosis not present

## 2016-03-01 DIAGNOSIS — Z79899 Other long term (current) drug therapy: Secondary | ICD-10-CM | POA: Diagnosis not present

## 2016-04-04 DIAGNOSIS — I10 Essential (primary) hypertension: Secondary | ICD-10-CM | POA: Diagnosis not present

## 2016-04-18 ENCOUNTER — Emergency Department (HOSPITAL_COMMUNITY): Payer: PPO

## 2016-04-18 ENCOUNTER — Emergency Department (HOSPITAL_COMMUNITY)
Admission: EM | Admit: 2016-04-18 | Discharge: 2016-04-19 | Disposition: A | Discharge status: Skilled Nursing Facility | Payer: PPO | Attending: Emergency Medicine | Admitting: Emergency Medicine

## 2016-04-18 DIAGNOSIS — I441 Atrioventricular block, second degree: Secondary | ICD-10-CM | POA: Insufficient documentation

## 2016-04-18 DIAGNOSIS — I6789 Other cerebrovascular disease: Secondary | ICD-10-CM | POA: Diagnosis not present

## 2016-04-18 DIAGNOSIS — Z87891 Personal history of nicotine dependence: Secondary | ICD-10-CM | POA: Insufficient documentation

## 2016-04-18 DIAGNOSIS — R4182 Altered mental status, unspecified: Secondary | ICD-10-CM | POA: Diagnosis not present

## 2016-04-18 DIAGNOSIS — N390 Urinary tract infection, site not specified: Secondary | ICD-10-CM | POA: Diagnosis not present

## 2016-04-18 DIAGNOSIS — I2510 Atherosclerotic heart disease of native coronary artery without angina pectoris: Secondary | ICD-10-CM | POA: Insufficient documentation

## 2016-04-18 DIAGNOSIS — E11319 Type 2 diabetes mellitus with unspecified diabetic retinopathy without macular edema: Secondary | ICD-10-CM | POA: Insufficient documentation

## 2016-04-18 DIAGNOSIS — Z7984 Long term (current) use of oral hypoglycemic drugs: Secondary | ICD-10-CM | POA: Diagnosis not present

## 2016-04-18 DIAGNOSIS — Z794 Long term (current) use of insulin: Secondary | ICD-10-CM | POA: Diagnosis not present

## 2016-04-18 DIAGNOSIS — R001 Bradycardia, unspecified: Secondary | ICD-10-CM | POA: Diagnosis not present

## 2016-04-18 DIAGNOSIS — I10 Essential (primary) hypertension: Secondary | ICD-10-CM | POA: Insufficient documentation

## 2016-04-18 DIAGNOSIS — R4781 Slurred speech: Secondary | ICD-10-CM | POA: Diagnosis not present

## 2016-04-18 DIAGNOSIS — R319 Hematuria, unspecified: Secondary | ICD-10-CM | POA: Diagnosis not present

## 2016-04-18 DIAGNOSIS — E1121 Type 2 diabetes mellitus with diabetic nephropathy: Secondary | ICD-10-CM | POA: Diagnosis not present

## 2016-04-18 DIAGNOSIS — Z7902 Long term (current) use of antithrombotics/antiplatelets: Secondary | ICD-10-CM | POA: Diagnosis not present

## 2016-04-18 LAB — CBC WITH DIFFERENTIAL/PLATELET
Basophils Absolute: 0.1 10*3/uL (ref 0.0–0.1)
Basophils Relative: 1 %
EOS ABS: 0.2 10*3/uL (ref 0.0–0.7)
Eosinophils Relative: 3 %
HEMATOCRIT: 31.2 % — AB (ref 39.0–52.0)
HEMOGLOBIN: 9.9 g/dL — AB (ref 13.0–17.0)
LYMPHS ABS: 2.4 10*3/uL (ref 0.7–4.0)
LYMPHS PCT: 34 %
MCH: 29.7 pg (ref 26.0–34.0)
MCHC: 31.7 g/dL (ref 30.0–36.0)
MCV: 93.7 fL (ref 78.0–100.0)
MONOS PCT: 8 %
Monocytes Absolute: 0.6 10*3/uL (ref 0.1–1.0)
NEUTROS PCT: 54 %
Neutro Abs: 3.9 10*3/uL (ref 1.7–7.7)
Platelets: 282 10*3/uL (ref 150–400)
RBC: 3.33 MIL/uL — AB (ref 4.22–5.81)
RDW: 12.9 % (ref 11.5–15.5)
WBC: 7.2 10*3/uL (ref 4.0–10.5)

## 2016-04-18 LAB — COMPREHENSIVE METABOLIC PANEL
ALBUMIN: 3.2 g/dL — AB (ref 3.5–5.0)
ALK PHOS: 63 U/L (ref 38–126)
ALT: 10 U/L — ABNORMAL LOW (ref 17–63)
AST: 13 U/L — AB (ref 15–41)
Anion gap: 7 (ref 5–15)
BILIRUBIN TOTAL: 0.3 mg/dL (ref 0.3–1.2)
BUN: 18 mg/dL (ref 6–20)
CALCIUM: 9.5 mg/dL (ref 8.9–10.3)
CO2: 28 mmol/L (ref 22–32)
Chloride: 103 mmol/L (ref 101–111)
Creatinine, Ser: 1.15 mg/dL (ref 0.61–1.24)
GFR calc Af Amer: >60 mL/min (ref >60)
GFR calc non Af Amer: 55 mL/min — ABNORMAL LOW (ref >60)
GLUCOSE: 163 mg/dL — AB (ref 65–99)
Potassium: 4.2 mmol/L (ref 3.5–5.1)
SODIUM: 138 mmol/L (ref 135–145)
TOTAL PROTEIN: 6.4 g/dL — AB (ref 6.5–8.1)

## 2016-04-18 LAB — URINE MICROSCOPIC-ADD ON: BACTERIA UA: NONE SEEN

## 2016-04-18 LAB — URINALYSIS, ROUTINE W REFLEX MICROSCOPIC
BILIRUBIN URINE: NEGATIVE
Glucose, UA: NEGATIVE mg/dL
Hgb urine dipstick: NEGATIVE
Ketones, ur: NEGATIVE mg/dL
Leukocytes, UA: NEGATIVE
NITRITE: NEGATIVE
PH: 6 (ref 5.0–8.0)
Protein, ur: 30 mg/dL — AB
SPECIFIC GRAVITY, URINE: 1.013 (ref 1.005–1.030)

## 2016-04-18 LAB — PROTIME-INR
INR: 1.06 (ref 0.00–1.49)
Prothrombin Time: 14 seconds (ref 11.6–15.2)

## 2016-04-18 MED ORDER — SODIUM CHLORIDE 0.9 % IV BOLUS (SEPSIS)
250.0000 mL | Freq: Once | INTRAVENOUS | Status: AC
  Administered 2016-04-18: 250 mL via INTRAVENOUS

## 2016-04-18 MED ORDER — SODIUM CHLORIDE 0.9 % IV SOLN
INTRAVENOUS | Status: DC
  Administered 2016-04-18: 19:00:00 via INTRAVENOUS

## 2016-04-18 NOTE — ED Notes (Signed)
MD aware of vitals.

## 2016-04-18 NOTE — ED Notes (Signed)
Pt back in room.

## 2016-04-18 NOTE — ED Notes (Signed)
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

## 2016-04-18 NOTE — ED Notes (Signed)
Patient transported to MRI 

## 2016-04-18 NOTE — ED Notes (Signed)
[facility]  Patient sent with C/O altered mental status with onset sometime yesterday. Patient is alert and cooperative.

## 2016-04-19 ENCOUNTER — Encounter: Payer: Self-pay | Admitting: *Deleted

## 2016-04-19 DIAGNOSIS — R259 Unspecified abnormal involuntary movements: Secondary | ICD-10-CM | POA: Diagnosis not present

## 2016-04-19 DIAGNOSIS — W19XXXA Unspecified fall, initial encounter: Secondary | ICD-10-CM | POA: Diagnosis not present

## 2016-04-19 DIAGNOSIS — R4182 Altered mental status, unspecified: Secondary | ICD-10-CM | POA: Diagnosis not present

## 2016-04-19 DIAGNOSIS — R2689 Other abnormalities of gait and mobility: Secondary | ICD-10-CM | POA: Diagnosis not present

## 2016-04-19 NOTE — ED Provider Notes (Addendum)
CSN: [id number]     Arrival date & time 04/18/16  1632 History   First MD Initiated Contact with Patient 04/18/16 1634     Chief Complaint  Patient presents with  . Altered Mental Status     (Consider location/radiation/quality/duration/timing/severity/associated sxs/prior Treatment) Patient is a 82 y.o. male presenting with altered mental status. The history is provided by a relative, the nursing home and the EMS personnel. The history is limited by the condition of the patient.  Altered Mental Status Patient from [facility]. Brought in at the request of patient's daughter. They've noticed a change in his mental status since yesterday. They're concerned that there was a urinary tract infection with patient May have had another stroke. 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  Past Medical History  Diagnosis Date  . AODM 02/24/2009  . NEPHROPATHY, DIABETIC 07/10/2007  . DIABETIC  RETINOPATHY 07/10/2007  . HYPERLIPIDEMIA 07/10/2007  . PERIPHERAL NEUROPATHY 07/10/2007  . ESSENTIAL HYPERTENSION 10/07/2007  . CORONARY ARTERY DISEASE 07/10/2007  . GERD 02/17/2008  . ATAXIA 02/17/2008  . CHEST PAIN 06/28/2008  . BENIGN PROSTATIC HYPERTROPHY, HX OF 02/13/2009  . RISK OF FALLING 01/09/2010   Past Surgical History  Procedure Laterality Date  . Esophagogastroduodenoscopy  2008  . Cad  1993    graft [place]  . Irrigation and debridement sebaceous cyst    . Eye surgery      catarac  . Appendectomy    . Hernia repair     No family history on file. Social History  Substance Use Topics  . Smoking status: Former Smoker -- 6 years    Types: Pipe  . Smokeless tobacco: Not on file  . Alcohol Use: Not on file    Review of Systems  Unable to perform ROS: Mental status change      Allergies  Pioglitazone  Home Medications   Prior to Admission medications   Medication Sig Start Date End Date Taking? Authorizing Provider   Carboxymethylcellulose Sodium (REFRESH TEARS OP) Place 1 drop into both eyes 3 (three) times daily.   Yes Historical Provider, MD  Cholecalciferol (VITAMIN D-3) 1000 units CAPS Take 1 capsule by mouth daily.   Yes Historical Provider, MD  clopidogrel (PLAVIX) 75 MG tablet Take 75 mg by mouth daily.   Yes Historical Provider, MD  finasteride (PROSCAR) 5 MG tablet Take 1 tablet (5 mg total) by mouth daily. 11/05/13  Yes Kristian CoveyBruce W Burchette, MD  furosemide (LASIX) 20 MG tablet Take 1 tablet (20 mg total) by mouth daily. 11/05/13  Yes Kristian CoveyBruce W Burchette, MD  insulin aspart protamine- aspart (NOVOLOG MIX 70/30) (70-30) 100 UNIT/ML injection Inject 11 Units into the skin 2 (two) times daily with a meal.   Yes Historical Provider, MD  insulin NPH-insulin regular (HUMULIN 70/30) (70-30) 100 UNIT/ML injection 20 units [place] in the AM and 25 units SQ in the PM Please provide patient with 3 month supply at a time.  Thank you Patient not taking: Reported on 04/18/2016 09/25/12   Kristian CoveyBruce W Burchette, MD  Insulin Syringe-Needle U-100 30G X 5/16" 0.5 ML MISC by Does not apply route. Sub-q two times a day    Yes Historical Provider, MD  lisinopril (PRINIVIL,ZESTRIL) 20 MG tablet Take 1 tablet (20 mg total) by mouth daily. 11/05/13  Yes Kristian CoveyBruce W Burchette, MD  loratadine (CLARITIN) 10 MG tablet Take 10 mg by mouth daily.   Yes Historical Provider, MD  meclizine (ANTIVERT) 12.5  MG tablet Take 12.5 mg by mouth every 4 (four) hours as needed for dizziness.   Yes Historical Provider, MD  metFORMIN (GLUCOPHAGE) 1000 MG tablet Take 1 tablet (1,000 mg total) by mouth 2 (two) times daily with a meal. 11/05/13  Yes Kristian Covey, MD  ranitidine (ZANTAC) 300 MG tablet Take 300 mg by mouth at bedtime.   Yes Historical Provider, MD  sennosides-docusate sodium (SENOKOT-S) 8.6-50 MG tablet Take 1 tablet by mouth daily as needed for constipation.   Yes Historical Provider, MD  simvastatin (ZOCOR) 80 MG tablet Take 1 tablet (80 mg total) by  mouth at bedtime. Patient not taking: Reported on 04/18/2016 11/05/13   Kristian Covey, MD  tamsulosin (FLOMAX) 0.4 MG CAPS capsule Take 1 capsule (0.4 mg total) by mouth daily. 11/05/13  Yes Kristian Covey, MD  vitamin B-12 (CYANOCOBALAMIN) 1000 MCG tablet Take 1,000 mcg by mouth daily.   Yes Historical Provider, MD   BP 160/76 mmHg  Pulse 50  Temp(Src) 98.1 F (36.7 C) (Oral)  Resp 16  SpO2 97% Physical Exam  Constitutional: He appears well-developed and well-nourished. No distress.  HENT:  Head: Normocephalic and atraumatic.  Mouth/Throat: Oropharynx is clear and moist.  Eyes: Conjunctivae and EOM are normal. Pupils are equal, round, and reactive to light.  Neck: Normal range of motion. Neck supple.  Cardiovascular: Regular rhythm and normal heart sounds.   Bradycardic  Pulmonary/Chest: Effort normal and breath sounds normal. No respiratory distress.  Abdominal: Soft. Bowel sounds are normal. There is no tenderness.  Musculoskeletal: Normal range of motion.  Neurological: He is alert.  Baseline left-sided weakness from prior stroke.  Skin: Skin is warm. No erythema.  Nursing note and vitals reviewed.   ED Course  Procedures (including critical care time) Labs Review Labs Reviewed  COMPREHENSIVE METABOLIC PANEL - Abnormal; Notable for the following:    Glucose, Bld 163 (*)    Total Protein 6.4 (*)    Albumin 3.2 (*)    AST 13 (*)    ALT 10 (*)    GFR calc non Af Amer 55 (*)    All other components within normal limits  URINALYSIS, ROUTINE W REFLEX MICROSCOPIC (NOT AT [facility]) - Abnormal; Notable for the following:    Protein, ur 30 (*)    All other components within normal limits  CBC WITH DIFFERENTIAL/PLATELET - Abnormal; Notable for the following:    RBC 3.33 (*)    Hemoglobin 9.9 (*)    HCT 31.2 (*)    All other components within normal limits  URINE MICROSCOPIC-ADD ON - Abnormal; Notable for the following:    Squamous Epithelial / LPF 0-5 (*)    All other  components within normal limits  PROTIME-INR   Results for orders placed or performed during the hospital encounter of 04/18/16  Comprehensive metabolic panel  Result Value Ref Range   Sodium 138 135 - 145 mmol/L   Potassium 4.2 3.5 - 5.1 mmol/L   Chloride 103 101 - 111 mmol/L   CO2 28 22 - 32 mmol/L   Glucose, Bld 163 (H) 65 - 99 mg/dL   BUN 18 6 - 20 mg/dL   Creatinine, Ser 1.61 0.61 - 1.24 mg/dL   Calcium 9.5 8.9 - 09.6 mg/dL   Total Protein 6.4 (L) 6.5 - 8.1 g/dL   Albumin 3.2 (L) 3.5 - 5.0 g/dL   AST 13 (L) 15 - 41 U/L   ALT 10 (L) 17 - 63 U/L   Alkaline  Phosphatase 63 38 - 126 U/L   Total Bilirubin 0.3 0.3 - 1.2 mg/dL   GFR calc non Af Amer 55 (L) >60 mL/min   GFR calc Af Amer >60 >60 mL/min   Anion gap 7 5 - 15  Protime-INR  Result Value Ref Range   Prothrombin Time 14.0 11.6 - 15.2 seconds   INR 1.06 0.00 - 1.49  Urinalysis, Routine w reflex microscopic (not at [facility])  Result Value Ref Range   Color, Urine YELLOW YELLOW   APPearance CLEAR CLEAR   Specific Gravity, Urine 1.013 1.005 - 1.030   pH 6.0 5.0 - 8.0   Glucose, UA NEGATIVE NEGATIVE mg/dL   Hgb urine dipstick NEGATIVE NEGATIVE   Bilirubin Urine NEGATIVE NEGATIVE   Ketones, ur NEGATIVE NEGATIVE mg/dL   Protein, ur 30 (A) NEGATIVE mg/dL   Nitrite NEGATIVE NEGATIVE   Leukocytes, UA NEGATIVE NEGATIVE  CBC with Differential/Platelet  Result Value Ref Range   WBC 7.2 4.0 - 10.5 K/uL   RBC 3.33 (L) 4.22 - 5.81 MIL/uL   Hemoglobin 9.9 (L) 13.0 - 17.0 g/dL   HCT 16.131.2 (L) 09.639.0 - 04.552.0 %   MCV 93.7 78.0 - 100.0 fL   MCH 29.7 26.0 - 34.0 pg   MCHC 31.7 30.0 - 36.0 g/dL   RDW 40.912.9 81.111.5 - 91.415.5 %   Platelets 282 150 - 400 K/uL   Neutrophils Relative % 54 %   Neutro Abs 3.9 1.7 - 7.7 K/uL   Lymphocytes Relative 34 %   Lymphs Abs 2.4 0.7 - 4.0 K/uL   Monocytes Relative 8 %   Monocytes Absolute 0.6 0.1 - 1.0 K/uL   Eosinophils Relative 3 %   Eosinophils Absolute 0.2 0.0 - 0.7 K/uL   Basophils Relative 1 %    Basophils Absolute 0.1 0.0 - 0.1 K/uL  Urine microscopic-add on  Result Value Ref Range   Squamous Epithelial / LPF 0-5 (A) NONE SEEN   WBC, UA 0-5 0 - 5 WBC/hpf   RBC / HPF 0-5 0 - 5 RBC/hpf   Bacteria, UA NONE SEEN NONE SEEN     Imaging Review Dg Chest 2 View  04/18/2016  CLINICAL DATA:  Altered mental status EXAM: CHEST  2 VIEW COMPARISON:  June 28, 2008 FINDINGS: There is no edema or consolidation. Heart is borderline enlarged with pulmonary vascularity within normal limits. Patient is status post median sternotomy. There is atherosclerotic calcification in the aortic arch region. There is no apparent adenopathy. There are foci of carotid artery calcification on the left. There is degenerative change in each shoulder. IMPRESSION: Borderline cardiac enlargement. No edema or consolidation. Aortic atherosclerosis. Left carotid artery calcification noted. Electronically Signed   By: Bretta BangWilliam  Woodruff III M.D.   On: 04/18/2016 18:56   Ct Head Wo Contrast  04/18/2016  CLINICAL DATA:  Altered mental status for 1 day EXAM: CT HEAD WITHOUT CONTRAST TECHNIQUE: Contiguous axial images were obtained from the base of the skull through the vertex without intravenous contrast. COMPARISON:  Brain MRI November 27, 2007 FINDINGS: Brain: There is moderate diffuse atrophy. There is no intracranial mass, hemorrhage, extra-axial fluid collection, or midline shift. There is evidence of a prior small infarct in the anterior mid right pons. There is patchy small vessel disease in the centra semiovale bilaterally. There is a small area of decreased attenuation in the medial left occipital lobe, consistent with a small age uncertain infarct. No other evidence of potential acute infarct on this study. Vascular: There are foci  of calcification and both cavernous carotid arteries as well as in the distal vertebral arteries bilaterally. Skull: The bony calvarium appears intact. Sinuses/Orbits: Orbits appear symmetric  bilaterally. There is opacification of several ethmoid air cells bilaterally. Paranasal sinuses elsewhere are clear. Other: There are several opacified mastoid air cells bilaterally. Most of the mastoid air cells are clear. There are no air-fluid levels in the mastoid regions. There is benign-appearing calcification in the scout adjacent to the right anterior parietal bone, likely residua from prior scalp hematoma. IMPRESSION: Atrophy with patchy periventricular small vessel disease. Age uncertain small infarct medial left occipital lobe. Prior small infarct anterior right pons. No hemorrhage or mass effect. Areas of ethmoid sinus disease. Opacification of several mastoid air cells bilaterally. Electronically Signed   By: Bretta Bang III M.D.   On: 04/18/2016 19:13   Mr Brain Wo Contrast  04/18/2016  CLINICAL DATA:  Initial evaluation for acute altered mental status. EXAM: MRI HEAD WITHOUT CONTRAST TECHNIQUE: Multiplanar, multiecho pulse sequences of the brain and surrounding structures were obtained without intravenous contrast. COMPARISON:  Prior CT from earlier the same day as well as previous MRI from 02/24/2008. FINDINGS: Study degraded by motion artifact. Diffuse prominence of the CSF containing spaces compatible with generalized cerebral atrophy. Mild scattered patchy and confluent T2/FLAIR hyperintensity within the periventricular and deep white matter most consistent with chronic small vessel ischemic disease, mild for age. No abnormal foci of restricted diffusion to suggest acute intracranial infarct. Gray-white matter differentiation maintained. Major intracranial vascular flow voids are preserved. Small chronic lacunar infarct within the right paramedian pons. Possible small amount of chronic hemorrhagic blood products present this remote infarct. No other remote infarcts identified. No acute intracranial hemorrhage. No mass lesion, midline shift, or mass effect. No hydrocephalus. No extra-axial  fluid collection. Mildly hyperintense FLAIR signal involving the bilateral temporal lobes is similar relative to previous MRI, and likely related to underlying atrophy. Craniocervical junction within normal limits. Visualized upper cervical spine unremarkable without significant degenerative changes. Pituitary gland within normal limits. No acute abnormality about the globes and orbits. Patient is status post lens extraction bilaterally. Scattered mucosal thickening within the ethmoidal air cells and maxillary sinuses. No air-fluid levels to suggest active sinus infection. Small bilateral mastoid effusions noted. Inner ear structures normal. Bone marrow signal intensity within normal limits. No scalp soft tissue abnormality. IMPRESSION: 1. No acute intracranial infarct or other process identified. 2. Remote lacunar infarct within the right paramedian pons. 3. Age-related cerebral atrophy with mild chronic small vessel ischemic disease. Electronically Signed   By: Rise Mu M.D.   On: 04/18/2016 23:33   I have personally reviewed and evaluated these images and lab results as part of my medical decision-making.   EKG Interpretation   Date/Time:  Thursday April 18 2016 16:38:35 EDT Ventricular Rate:  57 PR Interval:    QRS Duration: 90 QT Interval:  642 QTC Calculation: 543 R Axis:   -9 Text Interpretation:  Second degree heart block Mobitz I 2-degree AV block  (Wenckebach block) Short PR interval Low voltage, precordial leads  Borderline T abnormalities, lateral leads Prolonged QT interval Confirmed  by Deretha Emory  MD, Lilleigh Hechavarria [phone]) on 04/18/2016 6:10:54 PM      MDM   Final diagnoses:  Altered mental status, unspecified altered mental status type  Bradycardia    Patient brought in from [facility] for altered mental status that started yesterday. Family members were concerned that he had infection somewhere. But extensive workup here shows no evidence  of infection. No urinary  tract infection. Chest x-ray negative for pneumonia. CT scan of the head raise concerns perhaps for a subacute infarct. But MRI shows no evidence of any acute or subacute infarct. Just old infarcts that were already known about. Patient's monitor here showed of pericardial ranging from the 30s up into the upper 50s. No hypotension. Patient daughter states it that's been present for some time. In the past he was been monitored at the [place] and they have determined that it's stable. Monitor here looks like the second degree AV block, Mobitz type I.  New mild anemia but transfusion not required. No leukocytosis. No significant electrolyte abnormalities.  Not finding anything definitive to warrant admission will discuss with family members. Patient's mental status does seem to be depressed however patient will follow commands. Patient is hard of hearing. No fevers. No hypotension. Oxygen saturations on room air range from 93% to 97%.  We'll have patient referred to cardiology for further evaluation.  Vanetta Mulders, MD 04/19/16 0010  Vanetta Mulders, MD 04/19/16 [phone]

## 2016-04-19 NOTE — Discharge Instructions (Signed)
Extensive workup without any acute findings to include MRI of the brain without evidence of any acute infarct. Patient's EKG and monitor here consistent with second-degree AV block Mobitz type I. No evidence of dehydration no evidence of any urinary tract infection. No evidence of any pneumonia. Chest x-ray without any acute findings. Recommend follow-up by cardiology. Recommend he return for any new or worse symptoms.

## 2016-04-22 ENCOUNTER — Encounter: Payer: Self-pay | Admitting: Physician Assistant

## 2016-04-22 ENCOUNTER — Encounter: Payer: Medicaid Other | Admitting: Physician Assistant

## 2016-04-22 ENCOUNTER — Ambulatory Visit (INDEPENDENT_AMBULATORY_CARE_PROVIDER_SITE_OTHER): Payer: PPO | Admitting: Physician Assistant

## 2016-04-22 VITALS — BP 140/68 | HR 65 | Ht 68.0 in | Wt 186.0 lb

## 2016-04-22 DIAGNOSIS — I441 Atrioventricular block, second degree: Secondary | ICD-10-CM

## 2016-04-22 DIAGNOSIS — R001 Bradycardia, unspecified: Secondary | ICD-10-CM | POA: Diagnosis not present

## 2016-04-22 DIAGNOSIS — R4182 Altered mental status, unspecified: Secondary | ICD-10-CM | POA: Diagnosis not present

## 2016-04-22 DIAGNOSIS — I10 Essential (primary) hypertension: Secondary | ICD-10-CM | POA: Diagnosis not present

## 2016-04-22 DIAGNOSIS — I2510 Atherosclerotic heart disease of native coronary artery without angina pectoris: Secondary | ICD-10-CM | POA: Diagnosis not present

## 2016-04-22 NOTE — Progress Notes (Signed)
This encounter was created in error - please disregard.

## 2016-04-22 NOTE — Patient Instructions (Addendum)
Medication Instructions:  Your physician recommends that you continue on your current medications as directed. Please refer to the Current Medication list given to you today.  Labwork: Your physician recommends that you have lab work:tsh   Testing/Procedures: Your physician has recommended that you wear an event monitor. Event monitors are medical devices that record the heart's electrical activity. Doctors most often us these monitors to diagnose arrhythmias. Arrhythmias are problems with the speed or rhythm of the heartbeat. The monitor is a small, portable device. You can wear one while you do your normal daily activities. This is usually used to diagnose what is causing palpitations/syncope (passing out).    Follow-Up: Your physician recommends that you schedule  follow-up appointment with Ronie Spiesayna Dunn, PA   Any Other Special Instructions Will Be Listed Below (If Applicable).  F/u with Pcp for anemia   If you need a refill on your cardiac medications before your next appointment, please call your pharmacy.

## 2016-04-22 NOTE — Progress Notes (Signed)
Cardiology Office Note    Date:  04/22/2016  ID:  [name] [name], DOB [birth year], MRN [id number] PCP:  Verneita GriffesKAPLAN,DAVID MANN, MD  Cardiologist:  Bernardo Heateremotely Brodie   Chief Complaint: evaluate bradycardia  History of Present Illness:  [name] [name] is a 82 y.o. male with history of DM c/b retinopathy and nephropathy, HTN, HLD, BPH, CAD (s/p remote LIMA-LAD in the 1990s), stroke 10/2015 with residual left hemiparesis who presents back to clinic to re-establish care. Last cath 2001: continued patency of LIMA-LAD, mild progression of disease of Cx, continued small branch stenosis of small D1.   He was seen in the ED 04/18/16 with changes in mental status from [facility]. His daughter says that ever since his stroke, his mental status has gone somewhat downhill. He's had several 6-7 episodes of "falling asleep in his breakfast plate." He reportedly underwent 2D echo and 14-day event monitor at the [facility] which his daughter says were unremarkable. We do not presently have those records. She says she was surprised because he even had an episode of falling asleep in his plate while wearing the monitor at which time a nurse at the facility reported HR was 39. He did not press the button on the monitor any of these times. She took him to the ED on 04/18/16 because he wasn't acting like himself. UA was unremarkable. Labs 04/18/16: K 4.2, glucose 163, BUN/Cr 18/1.15, Hbg 9.9 (previously 12 seven years ago). CT head 04/18/16: small vessel disease, age-uncertain small infarct medial left occipital lobe, prior small infarct anterior right pons. F/u MR brain showed no acute process; old lacunar infarct. Per EDP notes, HR on the monitor was 30s into the upper 50s with 2nd degree AVB type 1. There were no definitive findings to warrant admission and the patient was discharged with recommendation to f/u here. He is not on any AVN blocking agents. EKG reviewed from 04/18/16 appears to show SR 57bpm with 2nd degree AVB type  1.  The patient presents today for further investigation of these episodes. He is not very participatory in the conversation - very hard of hearing and only gives brief answers. He denies any dizziness, CP, SOB or any other symptoms. When asked about the episodes of falling asleep in his breakfast, he says "that's very rare." No dizziness.   Past Medical History  Diagnosis Date  . Diabetes mellitus (HCC)   . NEPHROPATHY, DIABETIC   . DIABETIC  RETINOPATHY   . Hyperlipidemia   . Essential hypertension   . GERD 02/17/2008  . ATAXIA 02/17/2008  . BENIGN PROSTATIC HYPERTROPHY, HX OF 02/13/2009  . RISK OF FALLING 01/09/2010  . CAD (coronary artery disease)     a. s/p remote LIMA-LAD in the 1990s. b. Last cath 2001: continued patency of LIMA-LAD, mild progression of disease of Cx, continued small branch stenosis of small D1.    Past Surgical History  Procedure Laterality Date  . Esophagogastroduodenoscopy  2008  . Cad  1993    graft Lima Lad  . Irrigation and debridement sebaceous cyst    . Eye surgery      catarac  . Appendectomy    . Hernia repair      Current Medications: Current Outpatient Prescriptions  Medication Sig Dispense Refill  . Carboxymethylcellulose Sodium (REFRESH TEARS OP) Place 1 drop into both eyes 3 (three) times daily.    . Cholecalciferol (VITAMIN D-3) 1000 units CAPS Take 1 capsule by mouth daily.    . clopidogrel (PLAVIX) 75 MG  tablet Take 75 mg by mouth daily.    . finasteride (PROSCAR) 5 MG tablet Take 1 tablet (5 mg total) by mouth daily. 90 tablet 3  . furosemide (LASIX) 20 MG tablet Take 1 tablet (20 mg total) by mouth daily. 90 tablet 3  . insulin aspart protamine- aspart (NOVOLOG MIX 70/30) (70-30) 100 UNIT/ML injection Inject 11 Units into the skin 2 (two) times daily with a meal.    . lisinopril (PRINIVIL,ZESTRIL) 20 MG tablet Take 1 tablet (20 mg total) by mouth daily. 90 tablet 3  . loratadine (CLARITIN) 10 MG tablet Take 10 mg by mouth daily.    .  meclizine (ANTIVERT) 12.5 MG tablet Take 12.5 mg by mouth every 4 (four) hours as needed for dizziness.    . metFORMIN (GLUCOPHAGE) 1000 MG tablet Take 1 tablet (1,000 mg total) by mouth 2 (two) times daily with a meal. 180 tablet 2  . ranitidine (ZANTAC) 300 MG tablet Take 300 mg by mouth at bedtime.    . sennosides-docusate sodium (SENOKOT-S) 8.6-50 MG tablet Take 1 tablet by mouth daily as needed for constipation.    . tamsulosin (FLOMAX) 0.4 MG CAPS capsule Take 1 capsule (0.4 mg total) by mouth daily. 90 capsule 3  . vitamin B-12 (CYANOCOBALAMIN) 1000 MCG tablet Take 1,000 mcg by mouth daily.     No current facility-administered medications for this visit.     Allergies:   Pioglitazone   Social History   Social History  . Marital Status: Married    Spouse Name: N/A  . Number of Children: N/A  . Years of Education: N/A   Social History Main Topics  . Smoking status: Former Smoker -- 6 years    Types: Pipe  . Smokeless tobacco: None  . Alcohol Use: None  . Drug Use: None  . Sexual Activity: Not Asked   Other Topics Concern  . None   Social History Narrative     Family History:  The patient cannot recall his family history at this time.  ROS:   Please see the history of present illness.  All other systems are reviewed and otherwise negative.    PHYSICAL EXAM:   VS:  BP 140/68 mmHg  Pulse 65  Ht  (1.727 m)  Wt 186 lb (84.369 kg)  BMI 28.29 kg/m2  SpO2 98%  BMI: Body mass index is 28.29 kg/(m^2). GEN: Well nourished, well developed WM, in no acute distress HEENT: normocephalic, atraumatic Neck: no JVD, carotid bruits, or masses Cardiac: RRR occasional pause; soft SEM, no rubs or gallops, no edema  Respiratory:  clear to auscultation bilaterally, normal work of breathing GI: soft, nontender, nondistended, + BS MS: no deformity; kyphotic posture Skin: warm and dry, no rash Neuro:  Alert and Oriented x 3, Strength and sensation are intact, follows  commands Psych: euthymic mood, full affect  Wt Readings from Last 3 Encounters:  04/22/16 186 lb (84.369 kg)  04/18/16 192 lb (87.091 kg)  05/06/14 192 lb (87.091 kg)      Studies/Labs Reviewed:   EKG:  EKG was ordered today and personally reviewed by me and demonstrates NSR 65bpm with 2nd degree AVB type 1, nonspecific ST-T changes  Recent Labs: 04/18/2016: ALT 10*; BUN 18; Creatinine, Ser 1.15; Hemoglobin 9.9*; Platelets 282; Potassium 4.2; Sodium 138   Lipid Panel    Component Value Date/Time   CHOL 102 05/06/2014 1122   TRIG 133.0 05/06/2014 1122   HDL 36.40* 05/06/2014 1122   CHOLHDL 3 05/06/2014  1122   VLDL 26.6 05/06/2014 1122   LDLCALC 39 05/06/2014 1122    Additional studies/ records that were reviewed today include: Summarized above.    ASSESSMENT & PLAN:   The patient was seen and examined by myself and Dr. Tenny Crawoss; the plan was formulated per our discussion since this is a patient re-establishing care as a new patient.  1. Altered mental status - episodes of "falling asleep in his breakfast plate" are concerning for transient LOC due to symptomatic bradycardia. He is back to baseline per daughter report today. He is not on any AVN blocking agents. Recent monitor at the [facility] was reportedly unremarkable; only ordered for 14 days. Per discussion with Dr. Tenny Crawoss, recommendation would be to make sure there is correlation between HR and symptoms before placing pacemaker in this 82 year old gentleman with suspicion of dementia. Will proceed with 30-day monitor to extend monitoring time. Check TSH. We are working on getting records from [facility]. 2. Bradycardia with 2nd degree AVB type 1 - as above. Check thyroid function. Avoid HR slowing medications. 3. Systolic murmur - check 2D echo. 4. Remote CAD - no recent angina. He is maintained on Plavix due to h/o stroke. Avoid BB given h/o bradycardia. 5. Essential HTN - controlled.  Disposition: F/u with APP after monitor, me if  possible. Sooner PRN if findings suggest need for PPM.  Medication Adjustments/Labs and Tests Ordered: Current medicines are reviewed at length with the patient today.  Concerns regarding medicines are outlined above. Medication changes, Labs and Tests ordered today are summarized above and listed in the Patient Instructions accessible in Encounters.   Thomasene MohairSigned, Dayna Dunn PA-C  04/22/2016 2:49 PM    [facility] [address], [place], [place]  [address] Phone: [phone]; Fax: [phone]

## 2016-04-23 LAB — TSH: TSH: 3.16 mIU/L (ref 0.40–4.50)

## 2016-04-24 ENCOUNTER — Ambulatory Visit (INDEPENDENT_AMBULATORY_CARE_PROVIDER_SITE_OTHER): Payer: PPO

## 2016-04-24 DIAGNOSIS — R001 Bradycardia, unspecified: Secondary | ICD-10-CM

## 2016-04-24 DIAGNOSIS — R4182 Altered mental status, unspecified: Secondary | ICD-10-CM | POA: Diagnosis not present

## 2016-04-24 DIAGNOSIS — I2510 Atherosclerotic heart disease of native coronary artery without angina pectoris: Secondary | ICD-10-CM

## 2016-04-24 DIAGNOSIS — I441 Atrioventricular block, second degree: Secondary | ICD-10-CM | POA: Diagnosis not present

## 2016-04-24 DIAGNOSIS — I10 Essential (primary) hypertension: Secondary | ICD-10-CM

## 2016-04-29 DIAGNOSIS — G20 Parkinson's disease: Secondary | ICD-10-CM | POA: Diagnosis not present

## 2016-04-29 DIAGNOSIS — D519 Vitamin B12 deficiency anemia, unspecified: Secondary | ICD-10-CM | POA: Diagnosis not present

## 2016-04-29 DIAGNOSIS — G629 Polyneuropathy, unspecified: Secondary | ICD-10-CM | POA: Diagnosis not present

## 2016-04-29 DIAGNOSIS — E1140 Type 2 diabetes mellitus with diabetic neuropathy, unspecified: Secondary | ICD-10-CM | POA: Diagnosis not present

## 2016-05-17 ENCOUNTER — Encounter: Payer: Medicaid Other | Admitting: Physician Assistant

## 2016-05-17 ENCOUNTER — Ambulatory Visit: Payer: Medicaid Other | Admitting: Physician Assistant

## 2016-05-30 ENCOUNTER — Encounter: Payer: Self-pay | Admitting: Physician Assistant

## 2016-05-30 ENCOUNTER — Ambulatory Visit (INDEPENDENT_AMBULATORY_CARE_PROVIDER_SITE_OTHER): Payer: PPO | Admitting: Physician Assistant

## 2016-05-30 VITALS — BP 126/60 | HR 60 | Ht 68.0 in | Wt 172.4 lb

## 2016-05-30 DIAGNOSIS — R4182 Altered mental status, unspecified: Secondary | ICD-10-CM | POA: Diagnosis not present

## 2016-05-30 DIAGNOSIS — I2510 Atherosclerotic heart disease of native coronary artery without angina pectoris: Secondary | ICD-10-CM

## 2016-05-30 DIAGNOSIS — R001 Bradycardia, unspecified: Secondary | ICD-10-CM | POA: Diagnosis not present

## 2016-05-30 DIAGNOSIS — I441 Atrioventricular block, second degree: Secondary | ICD-10-CM | POA: Diagnosis not present

## 2016-05-30 DIAGNOSIS — I10 Essential (primary) hypertension: Secondary | ICD-10-CM

## 2016-05-30 NOTE — Patient Instructions (Addendum)
Medication Instructions:  Your physician recommends that you continue on your current medications as directed. Please refer to the Current Medication list given to you today.   Labwork: None ordered  Testing/Procedures: None ordered  Follow-Up. Your physician wants you to follow-up in: 1 YEAR WITH DR. Tenny CrawOSS.  You will receive a reminder letter in the mail two months in advance. If you don't receive a letter, please call our office to schedule the follow-up appointment.  Any Other Special Instructions Will Be Listed Below (If Applicable).  Wenckebach is the name of your heart block.    If you need a refill on your cardiac medications before your next appointment, please call your pharmacy.

## 2016-05-30 NOTE — Progress Notes (Signed)
Cardiology Office Note    Date:  05/30/2016  ID:  [name] DallasRaleigh D Trotter, DOB Jun 20, 1925, MRN [id number] PCP:  Verneita GriffesKAPLAN,DAVID MANN, MD  Cardiologist:  Tenny Crawoss  Chief Complaint: f/u event monitor  History of Present Illness:  [name] [name] is a 82 y.o. male with history of DM c/b retinopathy and nephropathy, HTN, HLD, BPH, CAD (s/p remote LIMA-LAD in the 1990s), stroke 10/2015 with residual left hemiparesis who presents back to clinic to re-establish care. Last cath 2001: continued patency of LIMA-LAD, mild progression of disease of Cx, continued small branch stenosis of small D1. His daughter has felt that ever since his stroke, his mental status has gone somewhat downhill. He's had several 6-7 episodes of "falling asleep in his breakfast plate." He reportedly underwent 2D echo and 14-day event monitor at the [facility] which his daughter saids were unremarkable (records have been difficult to obtain). At original OV with us on 04/22/16 she said she was surprised because he even had an episode of falling asleep in his plate while wearing the monitor at which time a nurse at his living facility reported HR was 39. He did not press the button on the monitor any of these times. She took him to the ED on 04/18/16 because he wasn't acting like himself. UA was unremarkable. Labs 04/18/16: K 4.2, glucose 163, BUN/Cr 18/1.15, Hbg 9.9 (previously 12 seven years ago). CT head 04/18/16: small vessel disease, age-uncertain small infarct medial left occipital lobe, prior small infarct anterior right pons. F/u MR brain showed no acute process; old lacunar infarct. Per EDP notes, HR on the monitor was 30s into the upper 50s with 2nd degree AVB type 1. EKG 04/18/16: SR 57bpm with 2nd degree AVB type 1. There were no definitive findings to warrant admission and the patient was discharged with recommendation to f/u here. He had not been on any AVN blocking agents. I saw him with Dr. Tenny Crawoss on 04/22/16 and symptoms were concerning for symptomatic  bradycardia. Event monitor was placed. TSH wnl.  Event monitor from 7/5-8/3 only showed baseline EKG of 2nd degree AVB type 1 with occasional PVCs, without significant events listed otherwise. It is actually not really clear if he wore the monitor but his daughter says when she visited, he did have it on. The facility has not reported any further episodes of him falling asleep in his plate. He does nod off (sleep) during our conversation today though but easily arouses. He has not had any anginal-type symptoms. BP stable. HR stable today.   Past Medical History:  Diagnosis Date  . ATAXIA 02/17/2008  . BENIGN PROSTATIC HYPERTROPHY, HX OF 02/13/2009  . CAD (coronary artery disease)    a. s/p remote LIMA-LAD in the 1990s. b. Last cath 2001: continued patency of LIMA-LAD, mild progression of disease of Cx, continued small branch stenosis of small D1.  . Diabetes mellitus (HCC)   . DIABETIC  RETINOPATHY   . Essential hypertension   . GERD 02/17/2008  . Hyperlipidemia   . NEPHROPATHY, DIABETIC   . RISK OF FALLING 01/09/2010  . Second degree AV block, Mobitz type I     Past Surgical History:  Procedure Laterality Date  . APPENDECTOMY    . CAD  1993   graft Lima Lad  . ESOPHAGOGASTRODUODENOSCOPY  2008  . EYE SURGERY     catarac  . HERNIA REPAIR    . IRRIGATION AND DEBRIDEMENT SEBACEOUS CYST      Current Medications: Current Outpatient Prescriptions  Medication Sig  Dispense Refill  . acetaminophen (TYLENOL) 325 MG tablet Take 650 mg by mouth every 6 (six) hours as needed.    . Carboxymethylcellulose Sodium (REFRESH TEARS OP) Place 1 drop into both eyes 3 (three) times daily.    . Cholecalciferol (VITAMIN D-3) 1000 units CAPS Take 1 capsule by mouth daily.    . clopidogrel (PLAVIX) 75 MG tablet Take 75 mg by mouth daily.    . finasteride (PROSCAR) 5 MG tablet Take 1 tablet (5 mg total) by mouth daily. 90 tablet 3  . furosemide (LASIX) 20 MG tablet Take 1 tablet (20 mg total) by mouth  daily. 90 tablet 3  . insulin aspart protamine- aspart (NOVOLOG MIX 70/30) (70-30) 100 UNIT/ML injection Inject 11 Units into the skin 2 (two) times daily with a meal.    . lisinopril (PRINIVIL,ZESTRIL) 20 MG tablet Take 1 tablet (20 mg total) by mouth daily. 90 tablet 3  . loratadine (CLARITIN) 10 MG tablet Take 10 mg by mouth daily.    . meclizine (ANTIVERT) 12.5 MG tablet Take 12.5 mg by mouth every 4 (four) hours as needed for dizziness.    . metFORMIN (GLUCOPHAGE) 1000 MG tablet Take 1 tablet (1,000 mg total) by mouth 2 (two) times daily with a meal. 180 tablet 2  . ranitidine (ZANTAC) 300 MG tablet Take 300 mg by mouth at bedtime.    . sennosides-docusate sodium (SENOKOT-S) 8.6-50 MG tablet Take 1 tablet by mouth daily as needed for constipation.    . tamsulosin (FLOMAX) 0.4 MG CAPS capsule Take 1 capsule (0.4 mg total) by mouth daily. 90 capsule 3  . vitamin B-12 (CYANOCOBALAMIN) 1000 MCG tablet Take 1,000 mcg by mouth daily.     No current facility-administered medications for this visit.      Allergies:   Pioglitazone   Social History   Social History  . Marital status: Married    Spouse name: N/A  . Number of children: N/A  . Years of education: N/A   Social History Main Topics  . Smoking status: Former Smoker    Years: 6.00    Types: Pipe  . Smokeless tobacco: None  . Alcohol use None  . Drug use: Unknown  . Sexual activity: Not Asked   Other Topics Concern  . None   Social History Narrative  . None     Family History:  The patient's Family history is unknown by patient. due to dementia  ROS:   Please see the history of present illness.  All other systems are reviewed and otherwise negative.    PHYSICAL EXAM:   VS:  BP 126/60 (BP Location: Left Arm, Patient Position: Sitting, Cuff Size: Normal)   Pulse 60   Ht 5\' 8"  (1.727 m)   Wt 172 lb 6.4 oz (78.2 kg)   BMI 26.21 kg/m   BMI: Body mass index is 26.21 kg/m. GEN: Well nourished, well developed, in no  acute distress  HEENT: normocephalic, atraumatic Neck: no JVD, carotid bruits, or masses Cardiac: RRR; no murmurs, rubs, or gallops, no edema  Respiratory:  clear to auscultation bilaterally, normal work of breathing GI: soft, nontender, nondistended, + BS MS: no deformity or atrophy  Skin: warm and dry, no rash Neuro:  Falls asleep easily. Hard of hearing. Takes a long time to answer any questions. Follows commands. Psych: euthymic mood, full affect  Wt Readings from Last 3 Encounters:  05/30/16 172 lb 6.4 oz (78.2 kg)  04/22/16 186 lb (84.4 kg)  04/18/16 192 lb (  87.1 kg)      Studies/Labs Reviewed:   EKG:  EKG was ordered today and personally reviewed by me and demonstrates NSR 71bpm, low voltage QRS, nonspecific ST-T changes  Recent Labs: 04/18/2016: ALT 10; BUN 18; Creatinine, Ser 1.15; Hemoglobin 9.9; Platelets 282; Potassium 4.2; Sodium 138 04/22/2016: TSH 3.16   Lipid Panel    Component Value Date/Time   CHOL 102 05/06/2014 1122   TRIG 133.0 05/06/2014 1122   HDL 36.40 (L) 05/06/2014 1122   CHOLHDL 3 05/06/2014 1122   VLDL 26.6 05/06/2014 1122   LDLCALC 39 05/06/2014 1122    Additional studies/ records that were reviewed today include: Summarized above.    ASSESSMENT & PLAN:   1. Altered mental status - we have not been able to establish clear-cut relationship with bradycardia at this time. Given advanced age, progressive dementia, failure to thrive, and lack of recurrent symptoms, will continue observation for further symptoms at this time. Avoid meds which may precipitate bradycardia. 2. Bradycardia with 2nd degree AVB type 1 - see above. Avoid AV nodal blocking agents or other medications that may precipitate bradycardia (some of the dementia meds can cause this but he is not presently on any). 3. Remote CAD - no recent anginal symptoms. Continue observation for recurrence. 4. Essential HTN - controlled.  Disposition: F/u with Dr. Tenny Craw in 1  year.   Medication Adjustments/Labs and Tests Ordered: Current medicines are reviewed at length with the patient today.  Concerns regarding medicines are outlined above. Medication changes, Labs and Tests ordered today are summarized above and listed in the Patient Instructions accessible in Encounters.   Thomasene Mohair PA-C  05/30/2016 10:10 AM    [facility] [address] [place], [place], [place]  [address] Phone: [phone]; Fax: [phone]

## 2016-06-07 DIAGNOSIS — R05 Cough: Secondary | ICD-10-CM | POA: Diagnosis not present

## 2016-06-13 DIAGNOSIS — R05 Cough: Secondary | ICD-10-CM | POA: Diagnosis not present

## 2016-06-13 DIAGNOSIS — R0989 Other specified symptoms and signs involving the circulatory and respiratory systems: Secondary | ICD-10-CM | POA: Diagnosis not present

## 2016-06-23 DIAGNOSIS — R0989 Other specified symptoms and signs involving the circulatory and respiratory systems: Secondary | ICD-10-CM | POA: Diagnosis not present

## 2016-06-23 DIAGNOSIS — R05 Cough: Secondary | ICD-10-CM | POA: Diagnosis not present

## 2016-07-04 DIAGNOSIS — I441 Atrioventricular block, second degree: Secondary | ICD-10-CM | POA: Diagnosis not present

## 2016-07-04 DIAGNOSIS — E1140 Type 2 diabetes mellitus with diabetic neuropathy, unspecified: Secondary | ICD-10-CM | POA: Diagnosis not present

## 2016-07-04 DIAGNOSIS — G20 Parkinson's disease: Secondary | ICD-10-CM | POA: Diagnosis not present

## 2016-07-04 DIAGNOSIS — I2510 Atherosclerotic heart disease of native coronary artery without angina pectoris: Secondary | ICD-10-CM | POA: Diagnosis not present

## 2016-09-05 ENCOUNTER — Telehealth: Payer: Self-pay

## 2016-09-05 NOTE — Telephone Encounter (Signed)
Call to Ms. Buck (POA) to fup on status of mr. [name] [name] and if he is still a patient of Dr. Caryl NeverBurchette. May be another provider now and would like to confirm.  To call back

## 2016-09-05 NOTE — Telephone Encounter (Signed)
Call back from [place], Stated her father had been in snf since 2015; Had recently not being doing well and just initiated hospice.  [facility];   Just FYI  sh

## 2017-01-02 DIAGNOSIS — I2510 Atherosclerotic heart disease of native coronary artery without angina pectoris: Secondary | ICD-10-CM | POA: Diagnosis not present

## 2017-01-02 DIAGNOSIS — F0151 Vascular dementia with behavioral disturbance: Secondary | ICD-10-CM | POA: Diagnosis not present

## 2017-01-02 DIAGNOSIS — E1140 Type 2 diabetes mellitus with diabetic neuropathy, unspecified: Secondary | ICD-10-CM | POA: Diagnosis not present

## 2017-01-02 DIAGNOSIS — N401 Enlarged prostate with lower urinary tract symptoms: Secondary | ICD-10-CM | POA: Diagnosis not present

## 2017-01-03 DIAGNOSIS — G4733 Obstructive sleep apnea (adult) (pediatric): Secondary | ICD-10-CM | POA: Diagnosis not present

## 2017-02-16 DIAGNOSIS — R05 Cough: Secondary | ICD-10-CM | POA: Diagnosis not present

## 2017-03-12 DIAGNOSIS — I259 Chronic ischemic heart disease, unspecified: Secondary | ICD-10-CM | POA: Diagnosis not present

## 2017-03-12 DIAGNOSIS — G20 Parkinson's disease: Secondary | ICD-10-CM | POA: Diagnosis not present

## 2017-03-12 DIAGNOSIS — F0151 Vascular dementia with behavioral disturbance: Secondary | ICD-10-CM | POA: Diagnosis not present

## 2017-03-12 DIAGNOSIS — E1140 Type 2 diabetes mellitus with diabetic neuropathy, unspecified: Secondary | ICD-10-CM | POA: Diagnosis not present

## 2017-05-21 DIAGNOSIS — I2510 Atherosclerotic heart disease of native coronary artery without angina pectoris: Secondary | ICD-10-CM | POA: Diagnosis not present

## 2017-05-21 DIAGNOSIS — I672 Cerebral atherosclerosis: Secondary | ICD-10-CM | POA: Diagnosis not present

## 2017-05-21 DIAGNOSIS — F0151 Vascular dementia with behavioral disturbance: Secondary | ICD-10-CM | POA: Diagnosis not present

## 2017-05-21 DIAGNOSIS — E1140 Type 2 diabetes mellitus with diabetic neuropathy, unspecified: Secondary | ICD-10-CM | POA: Diagnosis not present

## 2017-08-22 DIAGNOSIS — M79672 Pain in left foot: Secondary | ICD-10-CM | POA: Diagnosis not present

## 2017-08-22 DIAGNOSIS — M25572 Pain in left ankle and joints of left foot: Secondary | ICD-10-CM | POA: Diagnosis not present

## 2017-08-22 DIAGNOSIS — E1140 Type 2 diabetes mellitus with diabetic neuropathy, unspecified: Secondary | ICD-10-CM | POA: Diagnosis not present

## 2017-08-22 DIAGNOSIS — I259 Chronic ischemic heart disease, unspecified: Secondary | ICD-10-CM | POA: Diagnosis not present

## 2017-08-22 DIAGNOSIS — F0151 Vascular dementia with behavioral disturbance: Secondary | ICD-10-CM | POA: Diagnosis not present

## 2017-08-22 DIAGNOSIS — I441 Atrioventricular block, second degree: Secondary | ICD-10-CM | POA: Diagnosis not present

## 2017-09-06 DIAGNOSIS — R627 Adult failure to thrive: Secondary | ICD-10-CM | POA: Diagnosis not present

## 2017-09-06 DIAGNOSIS — I69398 Other sequelae of cerebral infarction: Secondary | ICD-10-CM | POA: Diagnosis not present

## 2017-09-06 DIAGNOSIS — L89523 Pressure ulcer of left ankle, stage 3: Secondary | ICD-10-CM | POA: Diagnosis not present

## 2017-09-06 DIAGNOSIS — F0151 Vascular dementia with behavioral disturbance: Secondary | ICD-10-CM | POA: Diagnosis not present

## 2018-01-13 DIAGNOSIS — R4020 Unspecified coma: Secondary | ICD-10-CM | POA: Diagnosis not present

## 2018-01-13 DIAGNOSIS — J22 Unspecified acute lower respiratory infection: Secondary | ICD-10-CM | POA: Diagnosis not present

## 2018-01-19 DEATH — deceased

## 2018-03-19 IMAGING — CT CT HEAD W/O CM
3 series · 15 of 47 positions shown, 18 images · non-contrast
Comparison: Brain MRI November 27, 2007

CLINICAL DATA: Altered mental status for 1 day

EXAM:
CT HEAD WITHOUT CONTRAST
TECHNIQUE: Contiguous axial images were obtained from the base of the skull
through the vertex without intravenous contrast.

[Series 2: head 5.0 h30s · axial · 0.46mm/px · z∈[-84,+56]mm · 9 of 34 slices shown, 12 images]
[im 3/34  brain]
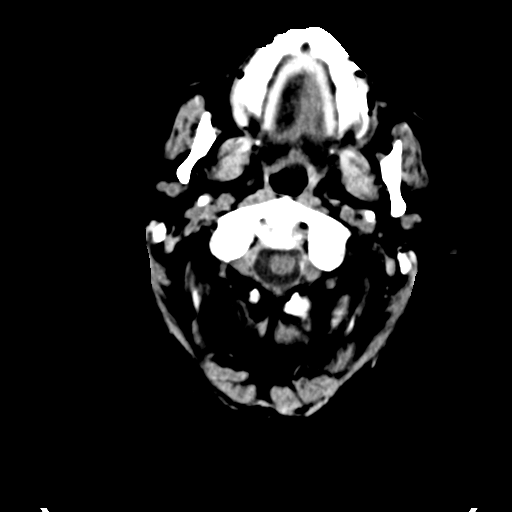
[im 3/34  bone]
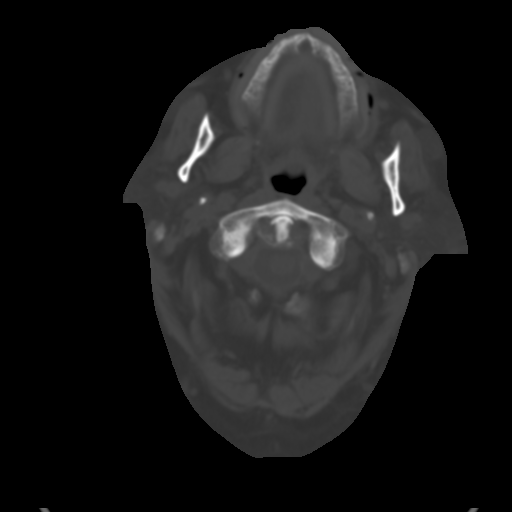
[im 6/34  brain]
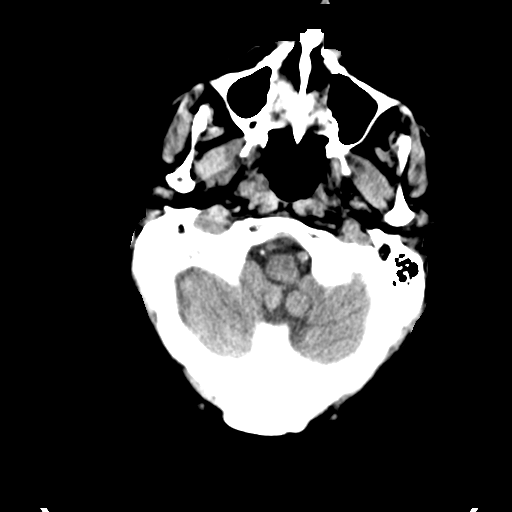
[im 10/34  brain]
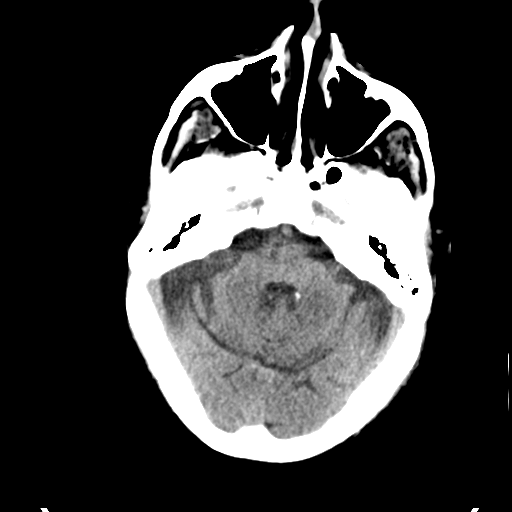
[im 13/34  brain]
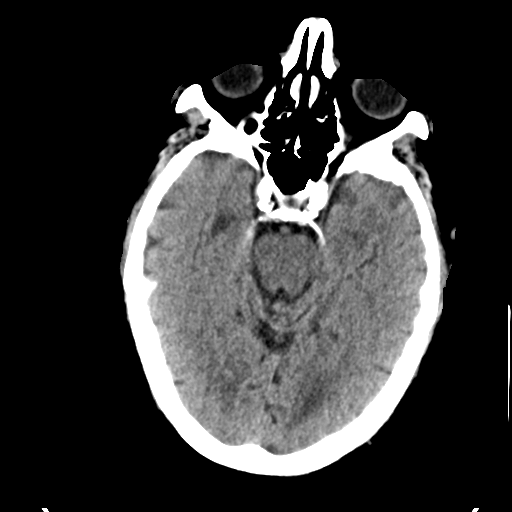
[im 18/34  brain]
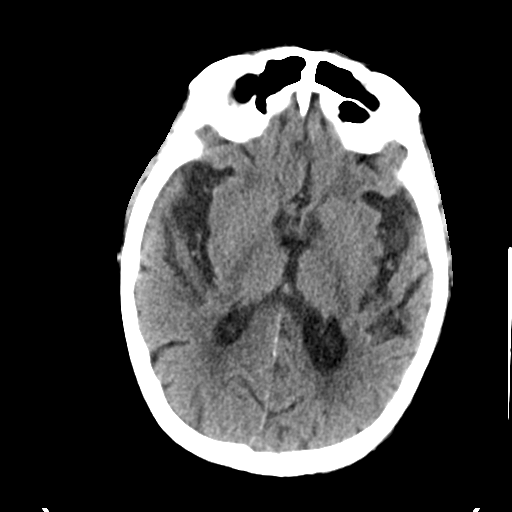
[im 18/34  bone]
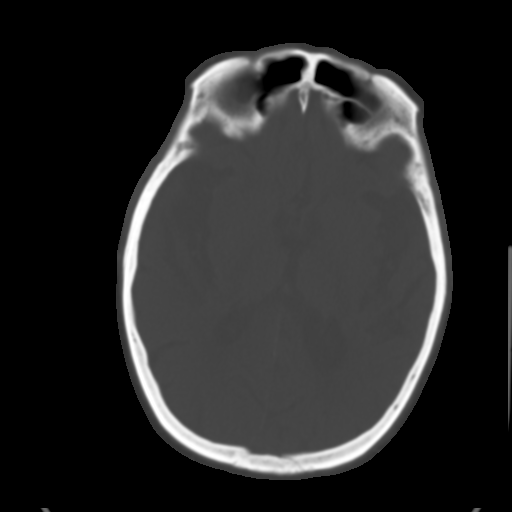
[im 21/34  brain]
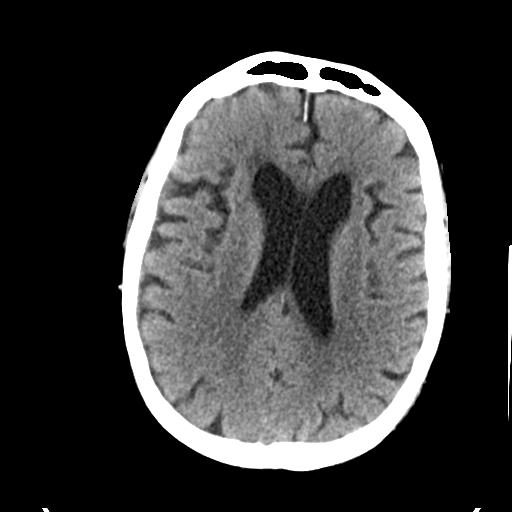
[im 24/34  brain]
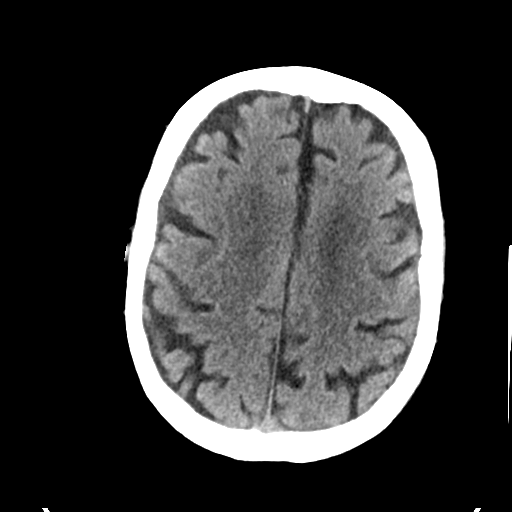
[im 28/34  brain]
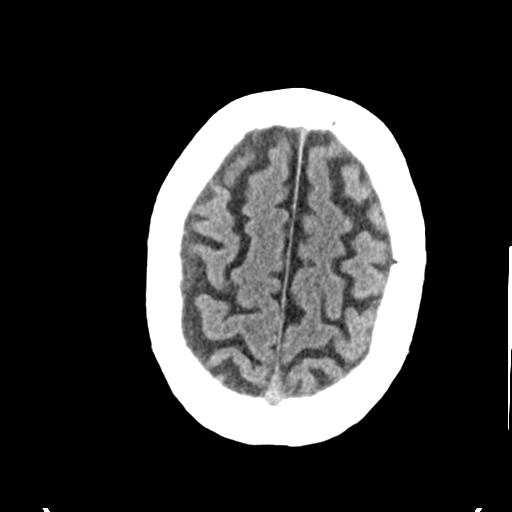
[im 31/34  brain]
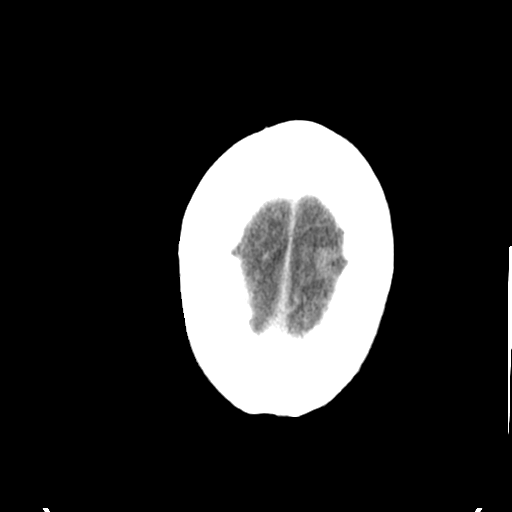
[im 31/34  bone]
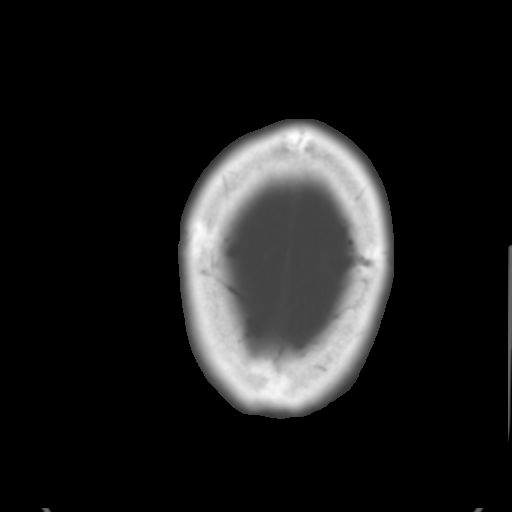

[Series 4: head 3.0 mpr · coronal · 0.32mm/px · 3 of 66 slices shown (1 of 2)]
[im 22/66  brain]
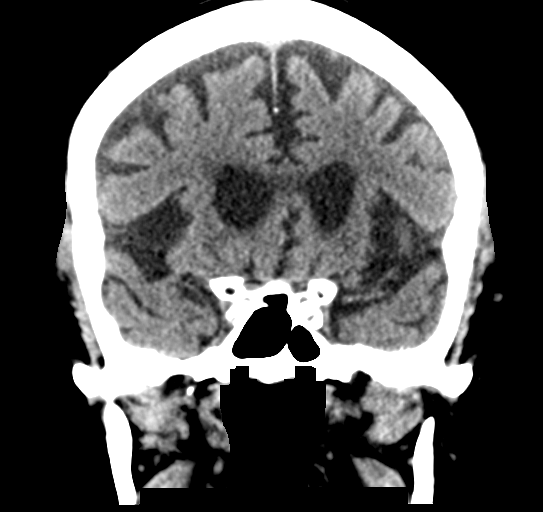
[im 29/66  brain]
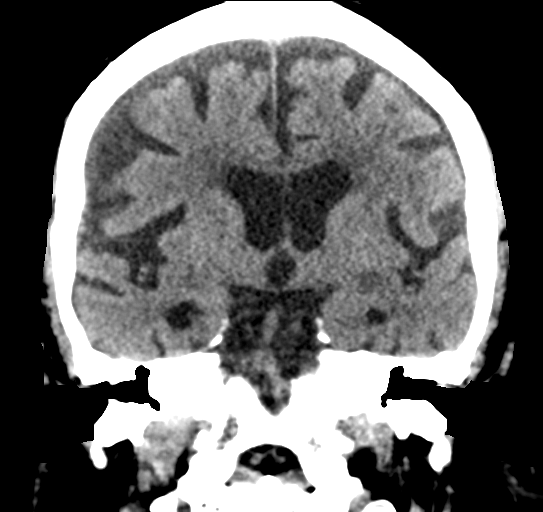
[im 37/66  brain]
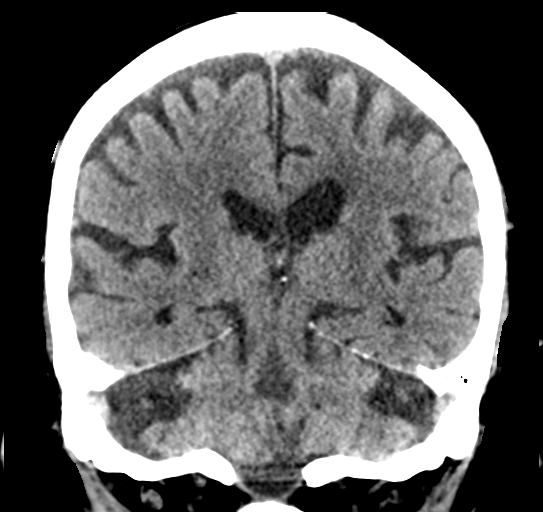

[Series 5: head 3.0 mpr · sagittal · 0.32mm/px · 3 of 59 slices shown (2 of 2)]
[im 20/59  brain]
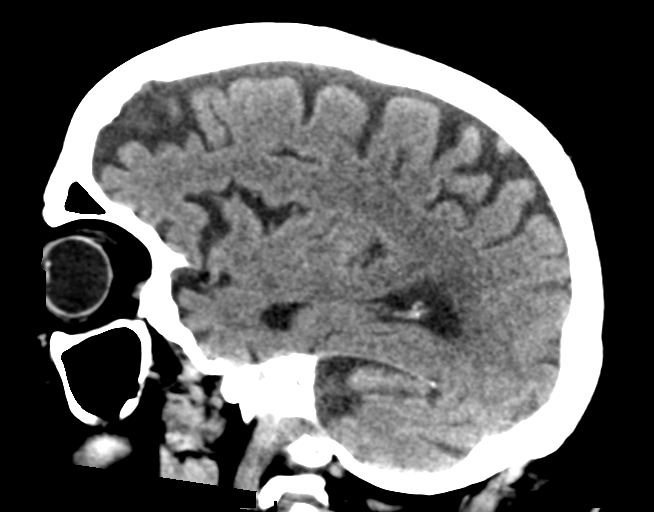
[im 30/59  brain]
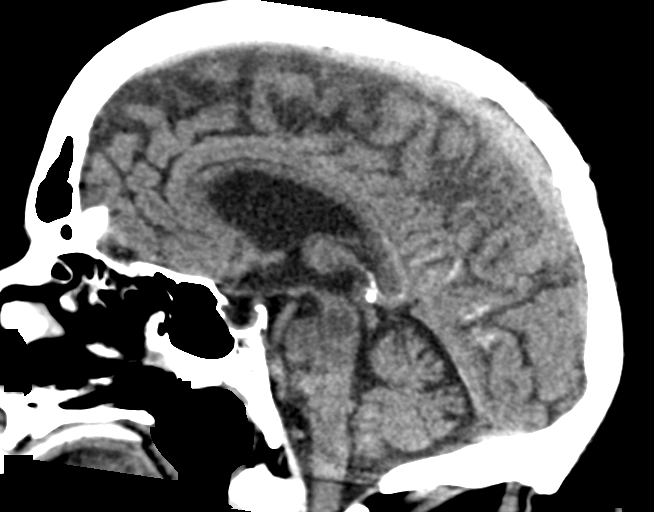
[im 39/59  brain]
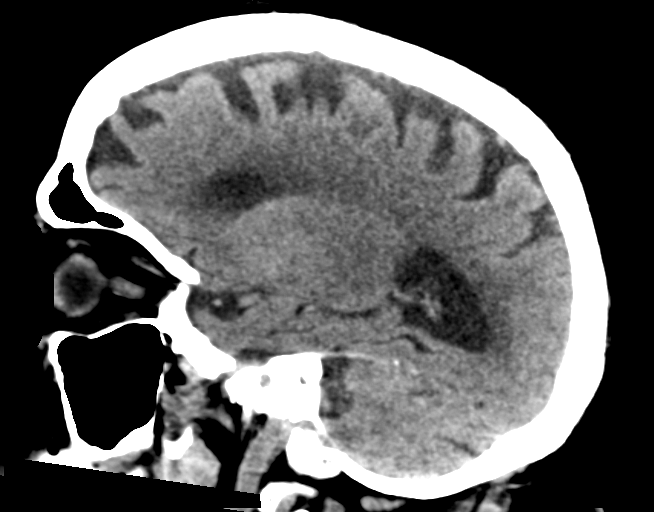

[15 of 47 positions shown; findings below may reference images not displayed]

FINDINGS: Brain: There is moderate diffuse atrophy. There is no intracranial
mass, hemorrhage, extra-axial fluid collection, or midline shift.
There is evidence of a prior small infarct in the anterior mid right
pons. There is patchy small vessel disease in the centra semiovale
bilaterally. There is a small area of decreased attenuation in the
medial left occipital lobe, consistent with a small age uncertain
infarct. No other evidence of potential acute infarct on this study.

Vascular: There are foci of calcification and both cavernous carotid
arteries as well as in the distal vertebral arteries bilaterally.

Skull: The bony calvarium appears intact.

Sinuses/Orbits: Orbits appear symmetric bilaterally. There is
opacification of several ethmoid air cells bilaterally. Paranasal
sinuses elsewhere are clear.

Other: There are several opacified mastoid air cells bilaterally.
Most of the mastoid air cells are clear. There are no air-fluid
levels in the mastoid regions. There is benign-appearing
calcification in the scout adjacent to the right anterior parietal
bone, likely residua from prior scalp hematoma.
IMPRESSION: Atrophy with patchy periventricular small vessel disease. Age
uncertain small infarct medial left occipital lobe. Prior small
infarct anterior right pons. No hemorrhage or mass effect.

Areas of ethmoid sinus disease. Opacification of several mastoid air
cells bilaterally.
